# Patient Record
Sex: Female | Born: 1955 | Race: White | Hispanic: No | Marital: Married | Smoking: Never smoker
Health system: Southern US, Community
[De-identification: ages and names within clinical notes are randomized; demographics above are authoritative.]

## PROBLEM LIST (undated history)

## (undated) DIAGNOSIS — T7840XA Allergy, unspecified, initial encounter: Secondary | ICD-10-CM

## (undated) DIAGNOSIS — C44320 Squamous cell carcinoma of skin of unspecified parts of face: Secondary | ICD-10-CM

## (undated) DIAGNOSIS — B019 Varicella without complication: Secondary | ICD-10-CM

## (undated) DIAGNOSIS — K635 Polyp of colon: Secondary | ICD-10-CM

## (undated) DIAGNOSIS — N83209 Unspecified ovarian cyst, unspecified side: Secondary | ICD-10-CM

## (undated) DIAGNOSIS — M1990 Unspecified osteoarthritis, unspecified site: Secondary | ICD-10-CM

## (undated) DIAGNOSIS — E785 Hyperlipidemia, unspecified: Secondary | ICD-10-CM

## (undated) DIAGNOSIS — I10 Essential (primary) hypertension: Secondary | ICD-10-CM

## (undated) HISTORY — DX: Unspecified ovarian cyst, unspecified side: N83.209

## (undated) HISTORY — PX: OOPHORECTOMY: SHX86

## (undated) HISTORY — DX: Unspecified osteoarthritis, unspecified site: M19.90

## (undated) HISTORY — DX: Varicella without complication: B01.9

## (undated) HISTORY — DX: Hyperlipidemia, unspecified: E78.5

## (undated) HISTORY — DX: Squamous cell carcinoma of skin of unspecified parts of face: C44.320

## (undated) HISTORY — DX: Polyp of colon: K63.5

## (undated) HISTORY — DX: Essential (primary) hypertension: I10

## (undated) HISTORY — DX: Allergy, unspecified, initial encounter: T78.40XA

---

## 2002-12-29 HISTORY — PX: OOPHORECTOMY: SHX86

## 2010-12-29 DIAGNOSIS — I10 Essential (primary) hypertension: Secondary | ICD-10-CM

## 2010-12-29 HISTORY — DX: Essential (primary) hypertension: I10

## 2012-12-29 DIAGNOSIS — M1990 Unspecified osteoarthritis, unspecified site: Secondary | ICD-10-CM

## 2012-12-29 HISTORY — DX: Unspecified osteoarthritis, unspecified site: M19.90

## 2015-10-01 ENCOUNTER — Other Ambulatory Visit: Payer: Self-pay | Admitting: Family Medicine

## 2015-10-02 ENCOUNTER — Other Ambulatory Visit: Payer: Self-pay | Admitting: Family Medicine

## 2015-10-02 DIAGNOSIS — N63 Unspecified lump in unspecified breast: Secondary | ICD-10-CM

## 2015-10-05 ENCOUNTER — Ambulatory Visit
Admission: RE | Admit: 2015-10-05 | Discharge: 2015-10-05 | Disposition: A | Discharge status: Home / Self Care | Payer: Commercial Indemnity | Source: Ambulatory Visit | Attending: Family Medicine | Admitting: Family Medicine

## 2015-10-05 ENCOUNTER — Other Ambulatory Visit: Payer: Self-pay | Admitting: Family Medicine

## 2015-10-05 DIAGNOSIS — N63 Unspecified lump in unspecified breast: Secondary | ICD-10-CM

## 2016-10-01 ENCOUNTER — Other Ambulatory Visit: Payer: Self-pay | Admitting: Family Medicine

## 2016-10-01 DIAGNOSIS — Z1231 Encounter for screening mammogram for malignant neoplasm of breast: Secondary | ICD-10-CM

## 2016-10-09 ENCOUNTER — Ambulatory Visit
Admission: RE | Admit: 2016-10-09 | Discharge: 2016-10-09 | Disposition: A | Discharge status: Home / Self Care | Payer: Managed Care, Other (non HMO) | Source: Ambulatory Visit | Attending: Family Medicine | Admitting: Family Medicine

## 2016-10-09 DIAGNOSIS — Z1231 Encounter for screening mammogram for malignant neoplasm of breast: Secondary | ICD-10-CM

## 2017-05-13 ENCOUNTER — Other Ambulatory Visit: Payer: Self-pay | Admitting: Family Medicine

## 2017-05-13 ENCOUNTER — Other Ambulatory Visit (HOSPITAL_COMMUNITY)
Admission: RE | Admit: 2017-05-13 | Discharge: 2017-05-13 | Disposition: A | Discharge status: Home / Self Care | Payer: Managed Care, Other (non HMO) | Source: Ambulatory Visit | Attending: Family Medicine | Admitting: Family Medicine

## 2017-05-13 DIAGNOSIS — Z1151 Encounter for screening for human papillomavirus (HPV): Secondary | ICD-10-CM | POA: Insufficient documentation

## 2017-05-13 DIAGNOSIS — Z01411 Encounter for gynecological examination (general) (routine) with abnormal findings: Secondary | ICD-10-CM | POA: Insufficient documentation

## 2017-05-14 LAB — CYTOLOGY - PAP
Diagnosis: NEGATIVE
HPV: NOT DETECTED

## 2017-11-10 ENCOUNTER — Other Ambulatory Visit: Payer: Self-pay | Admitting: Family Medicine

## 2017-11-10 DIAGNOSIS — Z1231 Encounter for screening mammogram for malignant neoplasm of breast: Secondary | ICD-10-CM

## 2017-12-09 ENCOUNTER — Encounter: Payer: Self-pay | Admitting: Radiology

## 2017-12-09 ENCOUNTER — Ambulatory Visit
Admission: RE | Admit: 2017-12-09 | Discharge: 2017-12-09 | Disposition: A | Discharge status: Home / Self Care | Payer: Managed Care, Other (non HMO) | Source: Ambulatory Visit | Attending: Family Medicine | Admitting: Family Medicine

## 2017-12-09 DIAGNOSIS — Z1231 Encounter for screening mammogram for malignant neoplasm of breast: Secondary | ICD-10-CM

## 2018-06-10 LAB — HM COLONOSCOPY

## 2018-12-17 ENCOUNTER — Other Ambulatory Visit: Payer: Self-pay | Admitting: Family Medicine

## 2018-12-17 DIAGNOSIS — Z1231 Encounter for screening mammogram for malignant neoplasm of breast: Secondary | ICD-10-CM

## 2019-01-24 ENCOUNTER — Ambulatory Visit
Admission: RE | Admit: 2019-01-24 | Discharge: 2019-01-24 | Disposition: A | Discharge status: Home / Self Care | Payer: PRIVATE HEALTH INSURANCE | Source: Ambulatory Visit | Attending: Family Medicine | Admitting: Family Medicine

## 2019-01-24 DIAGNOSIS — Z1231 Encounter for screening mammogram for malignant neoplasm of breast: Secondary | ICD-10-CM

## 2020-01-20 ENCOUNTER — Ambulatory Visit: Payer: PRIVATE HEALTH INSURANCE

## 2020-03-04 ENCOUNTER — Ambulatory Visit: Payer: No Typology Code available for payment source | Attending: Internal Medicine

## 2020-03-04 DIAGNOSIS — Z23 Encounter for immunization: Secondary | ICD-10-CM | POA: Insufficient documentation

## 2020-03-04 NOTE — Progress Notes (Signed)
   Covid-19 Vaccination Clinic  Name:  Sarah Frost    MRN: [id number] DOB: 03-Jun-1956  03/04/2020  [name] was observed post Covid-19 immunization for 15 minutes without incident. She was provided with Vaccine Information Sheet and instruction to access the V-Safe system.   [name] was instructed to call 911 with any severe reactions post vaccine: Marland Kitchen Difficulty breathing  . Swelling of face and throat  . A fast heartbeat  . A bad rash all over body  . Dizziness and weakness   Immunizations Administered    Name Date Dose VIS Date Route   Pfizer COVID-19 Vaccine 03/04/2020  6:23 PM 0.3 mL 12/09/2019 Intramuscular   Manufacturer: [place]   Lot: [id number]   [place]: [id number]

## 2020-03-19 ENCOUNTER — Other Ambulatory Visit: Payer: Self-pay | Admitting: Family Medicine

## 2020-03-19 DIAGNOSIS — Z1231 Encounter for screening mammogram for malignant neoplasm of breast: Secondary | ICD-10-CM

## 2020-03-27 ENCOUNTER — Ambulatory Visit: Payer: No Typology Code available for payment source | Attending: Internal Medicine

## 2020-03-27 DIAGNOSIS — Z23 Encounter for immunization: Secondary | ICD-10-CM

## 2020-03-27 NOTE — Progress Notes (Signed)
   Covid-19 Vaccination Clinic  Name:  Sarah Frost    MRN: [id number] DOB: 03-Mar-1956  03/27/2020  [name] was observed post Covid-19 immunization for 15 minutes without incident. She was provided with Vaccine Information Sheet and instruction to access the V-Safe system.   [name] was instructed to call 911 with any severe reactions post vaccine: Marland Kitchen Difficulty breathing  . Swelling of face and throat  . A fast heartbeat  . A bad rash all over body  . Dizziness and weakness   Immunizations Administered    Name Date Dose VIS Date Route   Pfizer COVID-19 Vaccine 03/27/2020  9:51 AM 0.3 mL 12/09/2019 Intramuscular   Manufacturer: [place]   Lot: [id number]   [place]: [id number]

## 2020-06-26 ENCOUNTER — Other Ambulatory Visit: Payer: Self-pay | Admitting: Family Medicine

## 2020-06-26 DIAGNOSIS — Z1231 Encounter for screening mammogram for malignant neoplasm of breast: Secondary | ICD-10-CM

## 2020-07-10 ENCOUNTER — Other Ambulatory Visit: Payer: Self-pay

## 2020-07-10 ENCOUNTER — Ambulatory Visit
Admission: RE | Admit: 2020-07-10 | Discharge: 2020-07-10 | Disposition: A | Discharge status: Home / Self Care | Payer: No Typology Code available for payment source | Source: Ambulatory Visit

## 2020-07-10 DIAGNOSIS — Z1231 Encounter for screening mammogram for malignant neoplasm of breast: Secondary | ICD-10-CM

## 2020-07-12 ENCOUNTER — Other Ambulatory Visit (HOSPITAL_COMMUNITY)
Admission: RE | Admit: 2020-07-12 | Discharge: 2020-07-12 | Disposition: A | Discharge status: Home / Self Care | Payer: No Typology Code available for payment source | Source: Ambulatory Visit | Attending: Family Medicine | Admitting: Family Medicine

## 2020-07-12 DIAGNOSIS — Z01411 Encounter for gynecological examination (general) (routine) with abnormal findings: Secondary | ICD-10-CM | POA: Diagnosis not present

## 2020-07-13 ENCOUNTER — Other Ambulatory Visit: Payer: Self-pay | Admitting: Family Medicine

## 2020-07-13 DIAGNOSIS — R928 Other abnormal and inconclusive findings on diagnostic imaging of breast: Secondary | ICD-10-CM

## 2020-07-17 LAB — CYTOLOGY - PAP
Comment: NEGATIVE
Diagnosis: NEGATIVE
High risk HPV: NEGATIVE

## 2020-07-18 ENCOUNTER — Other Ambulatory Visit: Payer: No Typology Code available for payment source

## 2020-07-20 LAB — HM PAP SMEAR: HM Pap smear: NORMAL

## 2020-07-20 LAB — RESULTS CONSOLE HPV: CHL HPV: NEGATIVE

## 2020-07-30 ENCOUNTER — Other Ambulatory Visit: Payer: Self-pay

## 2020-07-30 ENCOUNTER — Ambulatory Visit
Admission: RE | Admit: 2020-07-30 | Discharge: 2020-07-30 | Disposition: A | Discharge status: Home / Self Care | Payer: No Typology Code available for payment source | Source: Ambulatory Visit | Attending: Family Medicine | Admitting: Family Medicine

## 2020-07-30 ENCOUNTER — Ambulatory Visit: Payer: No Typology Code available for payment source

## 2020-07-30 DIAGNOSIS — R928 Other abnormal and inconclusive findings on diagnostic imaging of breast: Secondary | ICD-10-CM

## 2020-10-30 DIAGNOSIS — M1990 Unspecified osteoarthritis, unspecified site: Secondary | ICD-10-CM | POA: Insufficient documentation

## 2020-10-30 DIAGNOSIS — G43909 Migraine, unspecified, not intractable, without status migrainosus: Secondary | ICD-10-CM | POA: Insufficient documentation

## 2020-10-30 DIAGNOSIS — Z87448 Personal history of other diseases of urinary system: Secondary | ICD-10-CM | POA: Insufficient documentation

## 2020-10-30 DIAGNOSIS — N8110 Cystocele, unspecified: Secondary | ICD-10-CM | POA: Insufficient documentation

## 2020-10-30 DIAGNOSIS — I10 Essential (primary) hypertension: Secondary | ICD-10-CM | POA: Insufficient documentation

## 2020-10-30 HISTORY — DX: Personal history of other diseases of urinary system: Z87.448

## 2020-10-30 HISTORY — DX: Migraine, unspecified, not intractable, without status migrainosus: G43.909

## 2021-01-23 DIAGNOSIS — L820 Inflamed seborrheic keratosis: Secondary | ICD-10-CM | POA: Diagnosis not present

## 2021-01-23 DIAGNOSIS — L905 Scar conditions and fibrosis of skin: Secondary | ICD-10-CM | POA: Diagnosis not present

## 2021-01-23 DIAGNOSIS — D1801 Hemangioma of skin and subcutaneous tissue: Secondary | ICD-10-CM | POA: Diagnosis not present

## 2021-01-23 DIAGNOSIS — Z85828 Personal history of other malignant neoplasm of skin: Secondary | ICD-10-CM | POA: Diagnosis not present

## 2021-01-23 DIAGNOSIS — D225 Melanocytic nevi of trunk: Secondary | ICD-10-CM | POA: Diagnosis not present

## 2021-01-23 DIAGNOSIS — L821 Other seborrheic keratosis: Secondary | ICD-10-CM | POA: Diagnosis not present

## 2021-01-23 DIAGNOSIS — L570 Actinic keratosis: Secondary | ICD-10-CM | POA: Diagnosis not present

## 2021-01-23 DIAGNOSIS — D229 Melanocytic nevi, unspecified: Secondary | ICD-10-CM | POA: Diagnosis not present

## 2021-01-23 DIAGNOSIS — Z872 Personal history of diseases of the skin and subcutaneous tissue: Secondary | ICD-10-CM | POA: Diagnosis not present

## 2021-02-13 IMAGING — MG MM DIGITAL DIAGNOSTIC UNILAT*R* W/ TOMO W/ CAD
6 series · 6 of 18 positions shown · non-contrast
Comparison: Previous exam(s).

CLINICAL DATA: 64-year-old female recalled from 2D screening
mammogram dated 07/10/2020 for a possible right breast
mass/asymmetry.

EXAM:
DIGITAL DIAGNOSTIC UNILATERAL RIGHT MAMMOGRAM WITH TOMO AND CAD

[R CC synth-2D]
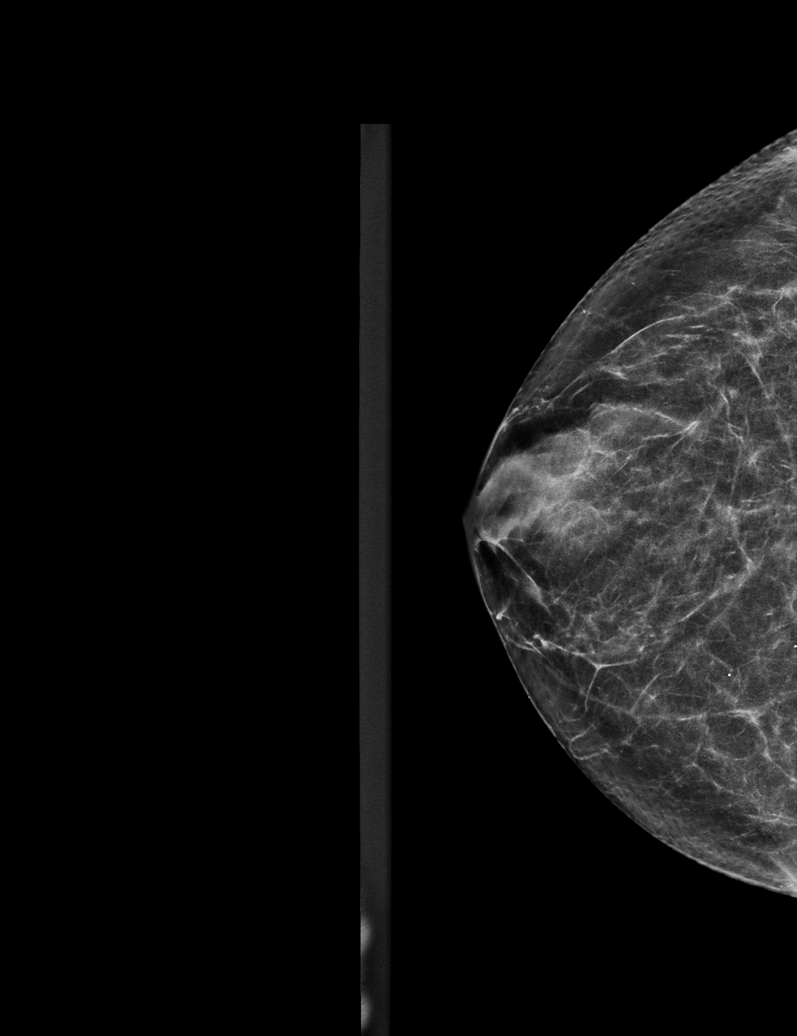

[R XCCL synth-2D]
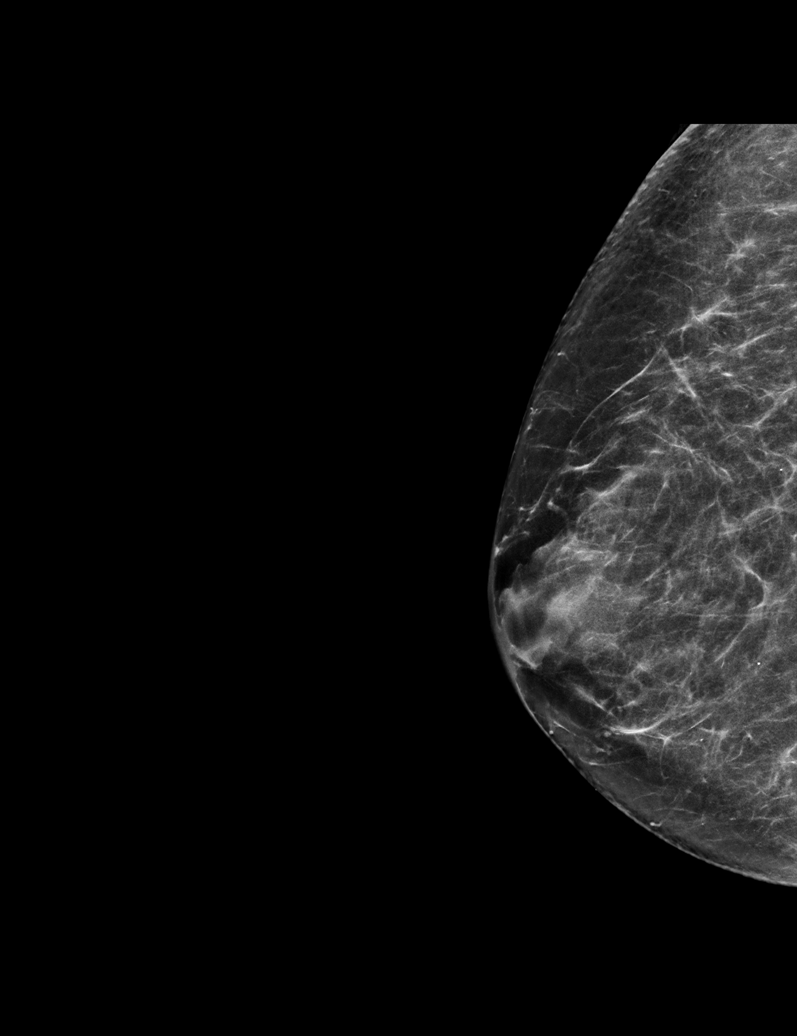

[R MLO synth-2D]
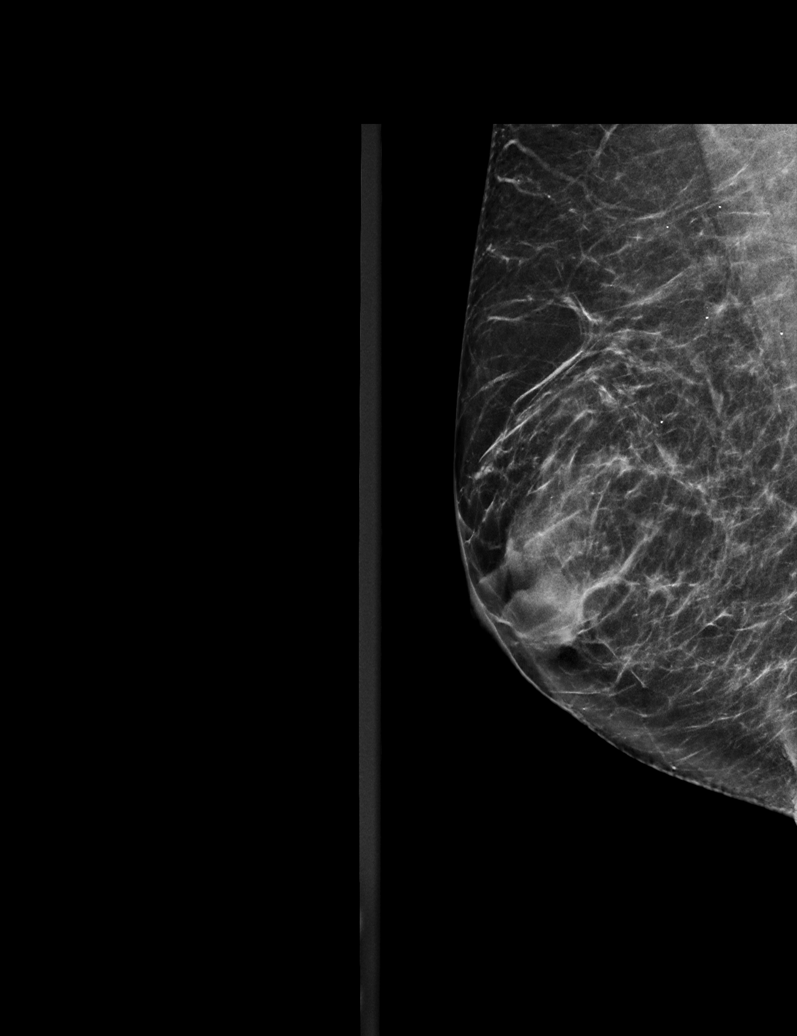

[R XCCL tomo · tomo slice 31/62.0]
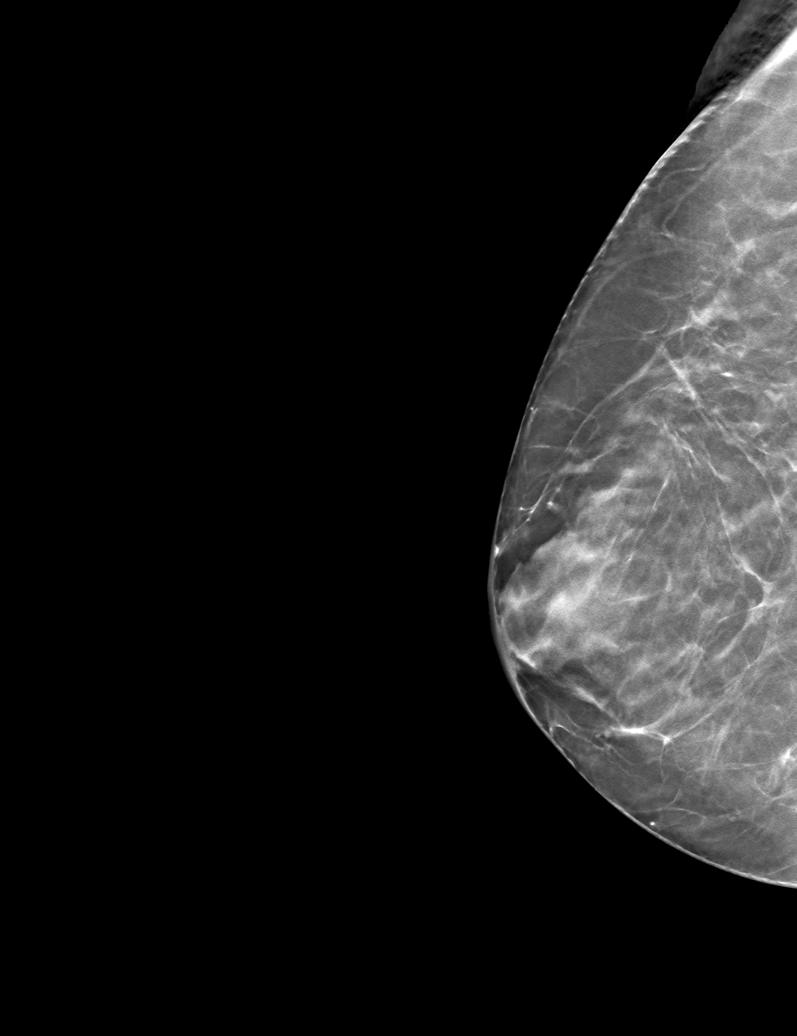

[R CC tomo · tomo slice 28/55.0]
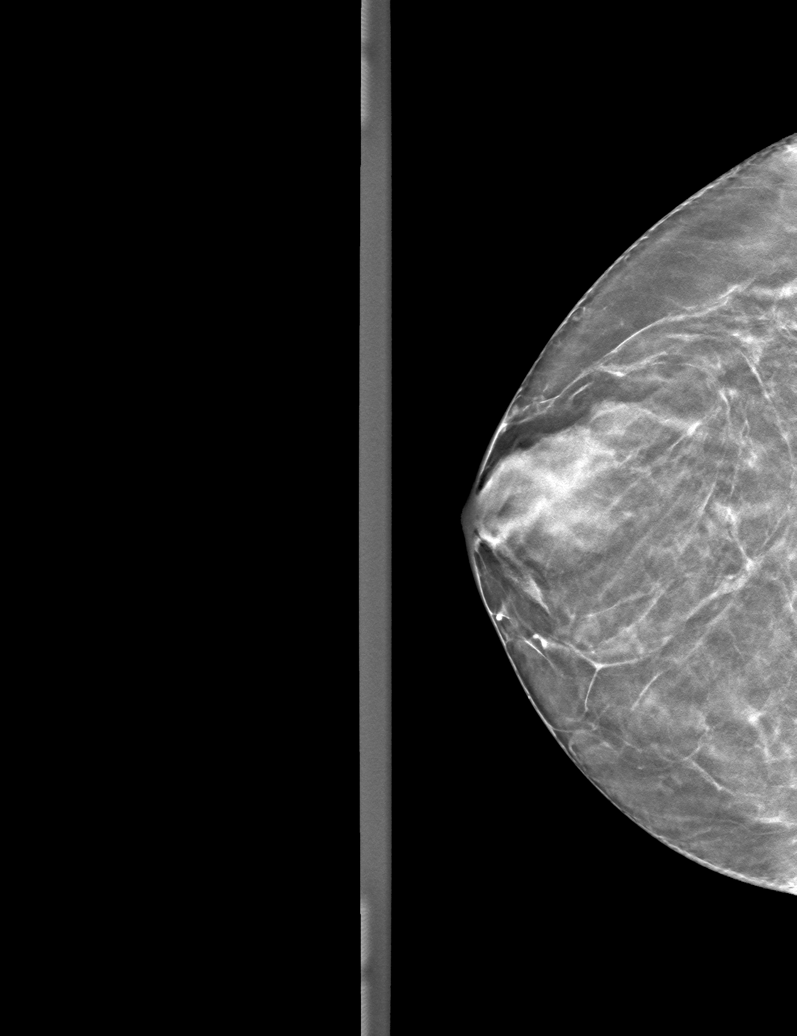

[R MLO tomo · tomo slice 31/60.0]
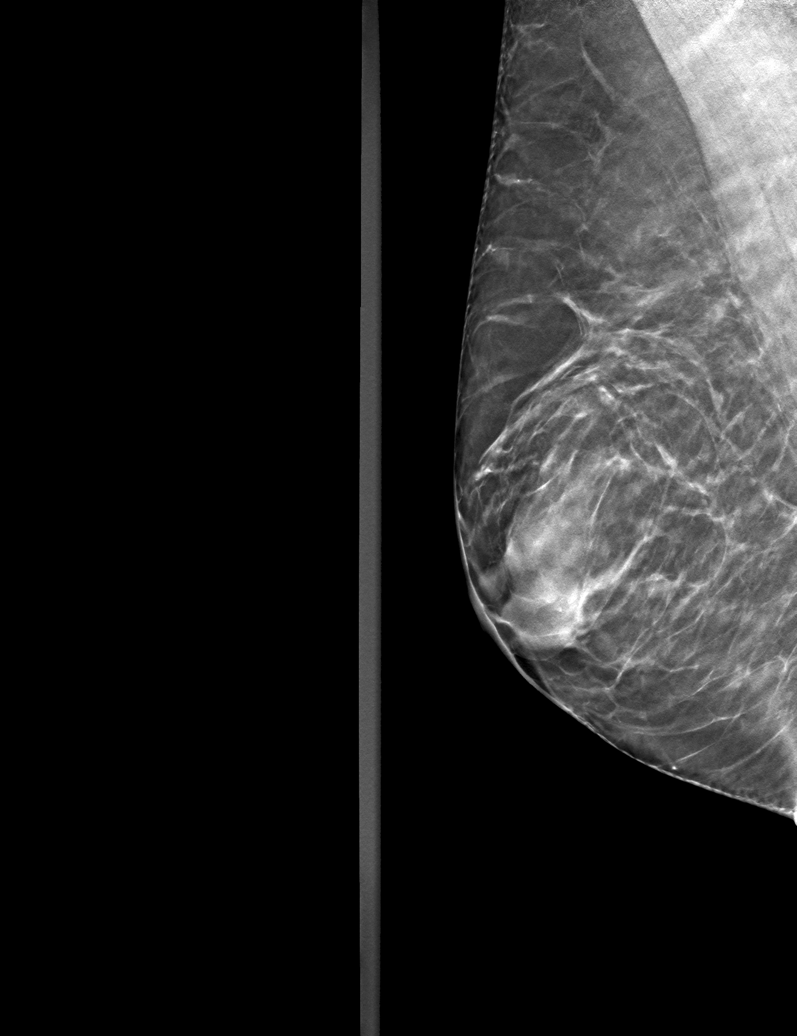

[6 of 18 positions shown; findings below may reference images not displayed]

ACR Breast Density Category b: There are scattered areas of
fibroglandular density.
FINDINGS: Previously described possible mass/asymmetry in the upper outer
right breast resolves into well dispersed fibroglandular tissue on
today's additional 3D views. No suspicious findings are identified.

Mammographic images were processed with CAD.
IMPRESSION: No mammographic evidence of malignancy.

RECOMMENDATION:
Screening mammogram in one year.(Code:[id number])

I have discussed the findings and recommendations with the patient.
If applicable, a reminder letter will be sent to the patient
regarding the next appointment.

BI-RADS CATEGORY  1: Negative.

## 2021-07-17 DIAGNOSIS — M25552 Pain in left hip: Secondary | ICD-10-CM | POA: Diagnosis not present

## 2021-07-17 DIAGNOSIS — M6281 Muscle weakness (generalized): Secondary | ICD-10-CM | POA: Diagnosis not present

## 2021-07-19 DIAGNOSIS — M25552 Pain in left hip: Secondary | ICD-10-CM | POA: Diagnosis not present

## 2021-07-19 DIAGNOSIS — M6281 Muscle weakness (generalized): Secondary | ICD-10-CM | POA: Diagnosis not present

## 2021-07-29 DIAGNOSIS — M6281 Muscle weakness (generalized): Secondary | ICD-10-CM | POA: Diagnosis not present

## 2021-07-29 DIAGNOSIS — M25552 Pain in left hip: Secondary | ICD-10-CM | POA: Diagnosis not present

## 2021-07-30 DIAGNOSIS — I8393 Asymptomatic varicose veins of bilateral lower extremities: Secondary | ICD-10-CM | POA: Diagnosis not present

## 2021-07-30 DIAGNOSIS — L905 Scar conditions and fibrosis of skin: Secondary | ICD-10-CM | POA: Diagnosis not present

## 2021-07-30 DIAGNOSIS — Z85828 Personal history of other malignant neoplasm of skin: Secondary | ICD-10-CM | POA: Diagnosis not present

## 2021-07-30 DIAGNOSIS — L578 Other skin changes due to chronic exposure to nonionizing radiation: Secondary | ICD-10-CM | POA: Diagnosis not present

## 2021-07-30 DIAGNOSIS — D1801 Hemangioma of skin and subcutaneous tissue: Secondary | ICD-10-CM | POA: Diagnosis not present

## 2021-07-30 DIAGNOSIS — D225 Melanocytic nevi of trunk: Secondary | ICD-10-CM | POA: Diagnosis not present

## 2021-07-30 DIAGNOSIS — D485 Neoplasm of uncertain behavior of skin: Secondary | ICD-10-CM | POA: Diagnosis not present

## 2021-07-30 DIAGNOSIS — L821 Other seborrheic keratosis: Secondary | ICD-10-CM | POA: Diagnosis not present

## 2021-07-30 DIAGNOSIS — L820 Inflamed seborrheic keratosis: Secondary | ICD-10-CM | POA: Diagnosis not present

## 2021-07-31 DIAGNOSIS — M6281 Muscle weakness (generalized): Secondary | ICD-10-CM | POA: Diagnosis not present

## 2021-07-31 DIAGNOSIS — M25552 Pain in left hip: Secondary | ICD-10-CM | POA: Diagnosis not present

## 2021-08-01 DIAGNOSIS — Z23 Encounter for immunization: Secondary | ICD-10-CM | POA: Diagnosis not present

## 2021-08-01 DIAGNOSIS — E785 Hyperlipidemia, unspecified: Secondary | ICD-10-CM | POA: Diagnosis not present

## 2021-08-01 DIAGNOSIS — I10 Essential (primary) hypertension: Secondary | ICD-10-CM | POA: Diagnosis not present

## 2021-08-01 DIAGNOSIS — Z1159 Encounter for screening for other viral diseases: Secondary | ICD-10-CM | POA: Diagnosis not present

## 2021-08-01 DIAGNOSIS — Z0000 Encounter for general adult medical examination without abnormal findings: Secondary | ICD-10-CM | POA: Diagnosis not present

## 2021-08-02 LAB — BASIC METABOLIC PANEL
Chloride: 96 — AB (ref 99–108)
Glucose: 84
Potassium: 3.9 mEq/L (ref 3.5–5.1)

## 2021-08-02 LAB — HEPATIC FUNCTION PANEL: Alkaline Phosphatase: 47 (ref 25–125)

## 2021-08-02 LAB — LIPID PANEL
HDL: 85 — AB (ref 35–70)
Triglycerides: 54 (ref 40–160)

## 2021-08-02 LAB — COMPREHENSIVE METABOLIC PANEL
Calcium: 9.6 (ref 8.7–10.7)
eGFR: 102

## 2021-08-06 ENCOUNTER — Other Ambulatory Visit: Payer: Self-pay | Admitting: Family Medicine

## 2021-08-06 DIAGNOSIS — E2839 Other primary ovarian failure: Secondary | ICD-10-CM

## 2021-08-10 LAB — HM HEPATITIS C SCREENING LAB: HM Hepatitis Screen: NEGATIVE

## 2021-08-13 DIAGNOSIS — M6281 Muscle weakness (generalized): Secondary | ICD-10-CM | POA: Diagnosis not present

## 2021-08-13 DIAGNOSIS — M25552 Pain in left hip: Secondary | ICD-10-CM | POA: Diagnosis not present

## 2021-08-15 DIAGNOSIS — M25552 Pain in left hip: Secondary | ICD-10-CM | POA: Diagnosis not present

## 2021-08-15 DIAGNOSIS — M6281 Muscle weakness (generalized): Secondary | ICD-10-CM | POA: Diagnosis not present

## 2021-08-19 ENCOUNTER — Other Ambulatory Visit: Payer: Self-pay | Admitting: Family Medicine

## 2021-08-19 DIAGNOSIS — Z1231 Encounter for screening mammogram for malignant neoplasm of breast: Secondary | ICD-10-CM

## 2021-08-19 DIAGNOSIS — M6281 Muscle weakness (generalized): Secondary | ICD-10-CM | POA: Diagnosis not present

## 2021-08-19 DIAGNOSIS — M25552 Pain in left hip: Secondary | ICD-10-CM | POA: Diagnosis not present

## 2021-08-22 ENCOUNTER — Other Ambulatory Visit: Payer: Self-pay

## 2021-08-22 DIAGNOSIS — M25552 Pain in left hip: Secondary | ICD-10-CM | POA: Diagnosis not present

## 2021-08-22 DIAGNOSIS — M6281 Muscle weakness (generalized): Secondary | ICD-10-CM | POA: Diagnosis not present

## 2021-08-27 DIAGNOSIS — M25552 Pain in left hip: Secondary | ICD-10-CM | POA: Diagnosis not present

## 2021-08-27 DIAGNOSIS — M6281 Muscle weakness (generalized): Secondary | ICD-10-CM | POA: Diagnosis not present

## 2021-08-29 DIAGNOSIS — M6281 Muscle weakness (generalized): Secondary | ICD-10-CM | POA: Diagnosis not present

## 2021-08-29 DIAGNOSIS — M25552 Pain in left hip: Secondary | ICD-10-CM | POA: Diagnosis not present

## 2021-09-03 DIAGNOSIS — M25552 Pain in left hip: Secondary | ICD-10-CM | POA: Diagnosis not present

## 2021-09-03 DIAGNOSIS — M6281 Muscle weakness (generalized): Secondary | ICD-10-CM | POA: Diagnosis not present

## 2021-09-05 DIAGNOSIS — M6281 Muscle weakness (generalized): Secondary | ICD-10-CM | POA: Diagnosis not present

## 2021-09-05 DIAGNOSIS — M25552 Pain in left hip: Secondary | ICD-10-CM | POA: Diagnosis not present

## 2021-09-17 DIAGNOSIS — M6281 Muscle weakness (generalized): Secondary | ICD-10-CM | POA: Diagnosis not present

## 2021-09-17 DIAGNOSIS — M25552 Pain in left hip: Secondary | ICD-10-CM | POA: Diagnosis not present

## 2021-09-20 DIAGNOSIS — M6281 Muscle weakness (generalized): Secondary | ICD-10-CM | POA: Diagnosis not present

## 2021-09-20 DIAGNOSIS — M25552 Pain in left hip: Secondary | ICD-10-CM | POA: Diagnosis not present

## 2021-09-27 ENCOUNTER — Other Ambulatory Visit: Payer: Self-pay | Admitting: Family Medicine

## 2021-09-27 DIAGNOSIS — Z1231 Encounter for screening mammogram for malignant neoplasm of breast: Secondary | ICD-10-CM

## 2021-09-30 DIAGNOSIS — Z1231 Encounter for screening mammogram for malignant neoplasm of breast: Secondary | ICD-10-CM

## 2021-10-17 DIAGNOSIS — H2513 Age-related nuclear cataract, bilateral: Secondary | ICD-10-CM | POA: Diagnosis not present

## 2021-10-17 DIAGNOSIS — Z0100 Encounter for examination of eyes and vision without abnormal findings: Secondary | ICD-10-CM | POA: Diagnosis not present

## 2021-10-28 ENCOUNTER — Ambulatory Visit
Admission: RE | Admit: 2021-10-28 | Discharge: 2021-10-28 | Disposition: A | Discharge status: Home / Self Care | Payer: Medicare HMO | Source: Ambulatory Visit

## 2021-10-28 ENCOUNTER — Other Ambulatory Visit: Payer: Self-pay

## 2021-10-28 DIAGNOSIS — Z1231 Encounter for screening mammogram for malignant neoplasm of breast: Secondary | ICD-10-CM | POA: Diagnosis not present

## 2021-11-11 DIAGNOSIS — J019 Acute sinusitis, unspecified: Secondary | ICD-10-CM | POA: Diagnosis not present

## 2021-11-11 DIAGNOSIS — H6690 Otitis media, unspecified, unspecified ear: Secondary | ICD-10-CM | POA: Diagnosis not present

## 2021-11-15 DIAGNOSIS — I10 Essential (primary) hypertension: Secondary | ICD-10-CM | POA: Diagnosis not present

## 2021-11-15 DIAGNOSIS — H9201 Otalgia, right ear: Secondary | ICD-10-CM | POA: Diagnosis not present

## 2022-01-06 ENCOUNTER — Ambulatory Visit
Admission: RE | Admit: 2022-01-06 | Discharge: 2022-01-06 | Disposition: A | Discharge status: Home / Self Care | Payer: Medicare Other | Source: Ambulatory Visit | Attending: Family Medicine | Admitting: Family Medicine

## 2022-01-06 ENCOUNTER — Other Ambulatory Visit: Payer: Self-pay

## 2022-01-06 DIAGNOSIS — E2839 Other primary ovarian failure: Secondary | ICD-10-CM

## 2022-01-06 DIAGNOSIS — M8589 Other specified disorders of bone density and structure, multiple sites: Secondary | ICD-10-CM | POA: Diagnosis not present

## 2022-01-06 DIAGNOSIS — Z780 Asymptomatic menopausal state: Secondary | ICD-10-CM | POA: Diagnosis not present

## 2022-01-27 DIAGNOSIS — I10 Essential (primary) hypertension: Secondary | ICD-10-CM | POA: Diagnosis not present

## 2022-01-27 DIAGNOSIS — M79606 Pain in leg, unspecified: Secondary | ICD-10-CM | POA: Diagnosis not present

## 2022-01-29 DIAGNOSIS — R252 Cramp and spasm: Secondary | ICD-10-CM | POA: Diagnosis not present

## 2022-01-29 DIAGNOSIS — I872 Venous insufficiency (chronic) (peripheral): Secondary | ICD-10-CM | POA: Diagnosis not present

## 2022-01-29 DIAGNOSIS — I10 Essential (primary) hypertension: Secondary | ICD-10-CM | POA: Diagnosis not present

## 2022-01-30 DIAGNOSIS — M79606 Pain in leg, unspecified: Secondary | ICD-10-CM | POA: Diagnosis not present

## 2022-01-30 DIAGNOSIS — M6281 Muscle weakness (generalized): Secondary | ICD-10-CM | POA: Diagnosis not present

## 2022-01-31 DIAGNOSIS — Z85828 Personal history of other malignant neoplasm of skin: Secondary | ICD-10-CM | POA: Diagnosis not present

## 2022-01-31 DIAGNOSIS — L718 Other rosacea: Secondary | ICD-10-CM | POA: Diagnosis not present

## 2022-01-31 DIAGNOSIS — L570 Actinic keratosis: Secondary | ICD-10-CM | POA: Diagnosis not present

## 2022-01-31 DIAGNOSIS — Z08 Encounter for follow-up examination after completed treatment for malignant neoplasm: Secondary | ICD-10-CM | POA: Diagnosis not present

## 2022-01-31 DIAGNOSIS — D225 Melanocytic nevi of trunk: Secondary | ICD-10-CM | POA: Diagnosis not present

## 2022-01-31 DIAGNOSIS — Z872 Personal history of diseases of the skin and subcutaneous tissue: Secondary | ICD-10-CM | POA: Diagnosis not present

## 2022-01-31 DIAGNOSIS — L814 Other melanin hyperpigmentation: Secondary | ICD-10-CM | POA: Diagnosis not present

## 2022-01-31 DIAGNOSIS — L821 Other seborrheic keratosis: Secondary | ICD-10-CM | POA: Diagnosis not present

## 2022-02-05 DIAGNOSIS — M79606 Pain in leg, unspecified: Secondary | ICD-10-CM | POA: Diagnosis not present

## 2022-02-05 DIAGNOSIS — M6281 Muscle weakness (generalized): Secondary | ICD-10-CM | POA: Diagnosis not present

## 2022-02-06 DIAGNOSIS — F4322 Adjustment disorder with anxiety: Secondary | ICD-10-CM | POA: Diagnosis not present

## 2022-02-07 DIAGNOSIS — M79606 Pain in leg, unspecified: Secondary | ICD-10-CM | POA: Diagnosis not present

## 2022-02-07 DIAGNOSIS — M6281 Muscle weakness (generalized): Secondary | ICD-10-CM | POA: Diagnosis not present

## 2022-02-10 DIAGNOSIS — M6281 Muscle weakness (generalized): Secondary | ICD-10-CM | POA: Diagnosis not present

## 2022-02-10 DIAGNOSIS — M79606 Pain in leg, unspecified: Secondary | ICD-10-CM | POA: Diagnosis not present

## 2022-02-17 DIAGNOSIS — M79606 Pain in leg, unspecified: Secondary | ICD-10-CM | POA: Diagnosis not present

## 2022-02-17 DIAGNOSIS — M6281 Muscle weakness (generalized): Secondary | ICD-10-CM | POA: Diagnosis not present

## 2022-02-20 DIAGNOSIS — F4322 Adjustment disorder with anxiety: Secondary | ICD-10-CM | POA: Diagnosis not present

## 2022-02-20 DIAGNOSIS — M6281 Muscle weakness (generalized): Secondary | ICD-10-CM | POA: Diagnosis not present

## 2022-02-20 DIAGNOSIS — M79606 Pain in leg, unspecified: Secondary | ICD-10-CM | POA: Diagnosis not present

## 2022-02-24 DIAGNOSIS — M79606 Pain in leg, unspecified: Secondary | ICD-10-CM | POA: Diagnosis not present

## 2022-02-24 DIAGNOSIS — M6281 Muscle weakness (generalized): Secondary | ICD-10-CM | POA: Diagnosis not present

## 2022-02-26 DIAGNOSIS — M79606 Pain in leg, unspecified: Secondary | ICD-10-CM | POA: Diagnosis not present

## 2022-02-26 DIAGNOSIS — M6281 Muscle weakness (generalized): Secondary | ICD-10-CM | POA: Diagnosis not present

## 2022-03-03 DIAGNOSIS — F4322 Adjustment disorder with anxiety: Secondary | ICD-10-CM | POA: Diagnosis not present

## 2022-03-12 DIAGNOSIS — I10 Essential (primary) hypertension: Secondary | ICD-10-CM | POA: Diagnosis not present

## 2022-03-12 DIAGNOSIS — I83893 Varicose veins of bilateral lower extremities with other complications: Secondary | ICD-10-CM | POA: Diagnosis not present

## 2022-03-12 DIAGNOSIS — I87323 Chronic venous hypertension (idiopathic) with inflammation of bilateral lower extremity: Secondary | ICD-10-CM | POA: Diagnosis not present

## 2022-03-12 DIAGNOSIS — I872 Venous insufficiency (chronic) (peripheral): Secondary | ICD-10-CM | POA: Diagnosis not present

## 2022-03-12 DIAGNOSIS — R252 Cramp and spasm: Secondary | ICD-10-CM | POA: Diagnosis not present

## 2022-03-13 DIAGNOSIS — U071 COVID-19: Secondary | ICD-10-CM | POA: Diagnosis not present

## 2022-03-17 DIAGNOSIS — F4322 Adjustment disorder with anxiety: Secondary | ICD-10-CM | POA: Diagnosis not present

## 2022-03-19 DIAGNOSIS — M6281 Muscle weakness (generalized): Secondary | ICD-10-CM | POA: Diagnosis not present

## 2022-03-19 DIAGNOSIS — M79606 Pain in leg, unspecified: Secondary | ICD-10-CM | POA: Diagnosis not present

## 2022-03-25 DIAGNOSIS — M6281 Muscle weakness (generalized): Secondary | ICD-10-CM | POA: Diagnosis not present

## 2022-03-25 DIAGNOSIS — M79606 Pain in leg, unspecified: Secondary | ICD-10-CM | POA: Diagnosis not present

## 2022-03-28 DIAGNOSIS — M6281 Muscle weakness (generalized): Secondary | ICD-10-CM | POA: Diagnosis not present

## 2022-03-28 DIAGNOSIS — M79606 Pain in leg, unspecified: Secondary | ICD-10-CM | POA: Diagnosis not present

## 2022-04-01 DIAGNOSIS — M79606 Pain in leg, unspecified: Secondary | ICD-10-CM | POA: Diagnosis not present

## 2022-04-01 DIAGNOSIS — M6281 Muscle weakness (generalized): Secondary | ICD-10-CM | POA: Diagnosis not present

## 2022-04-04 DIAGNOSIS — M79606 Pain in leg, unspecified: Secondary | ICD-10-CM | POA: Diagnosis not present

## 2022-04-04 DIAGNOSIS — M6281 Muscle weakness (generalized): Secondary | ICD-10-CM | POA: Diagnosis not present

## 2022-04-08 ENCOUNTER — Encounter: Payer: Self-pay | Admitting: Family Medicine

## 2022-04-08 DIAGNOSIS — E785 Hyperlipidemia, unspecified: Secondary | ICD-10-CM | POA: Insufficient documentation

## 2022-04-08 DIAGNOSIS — M25552 Pain in left hip: Secondary | ICD-10-CM | POA: Diagnosis not present

## 2022-04-08 DIAGNOSIS — N819 Female genital prolapse, unspecified: Secondary | ICD-10-CM | POA: Insufficient documentation

## 2022-04-08 DIAGNOSIS — E2839 Other primary ovarian failure: Secondary | ICD-10-CM | POA: Insufficient documentation

## 2022-04-08 DIAGNOSIS — M9905 Segmental and somatic dysfunction of pelvic region: Secondary | ICD-10-CM | POA: Diagnosis not present

## 2022-04-08 DIAGNOSIS — Z860100 Personal history of colon polyps, unspecified: Secondary | ICD-10-CM | POA: Insufficient documentation

## 2022-04-08 DIAGNOSIS — M79606 Pain in leg, unspecified: Secondary | ICD-10-CM | POA: Diagnosis not present

## 2022-04-08 DIAGNOSIS — M6281 Muscle weakness (generalized): Secondary | ICD-10-CM | POA: Diagnosis not present

## 2022-04-08 DIAGNOSIS — I83893 Varicose veins of bilateral lower extremities with other complications: Secondary | ICD-10-CM | POA: Insufficient documentation

## 2022-04-08 DIAGNOSIS — M545 Low back pain, unspecified: Secondary | ICD-10-CM | POA: Diagnosis not present

## 2022-04-08 DIAGNOSIS — M25562 Pain in left knee: Secondary | ICD-10-CM | POA: Diagnosis not present

## 2022-04-09 ENCOUNTER — Ambulatory Visit (INDEPENDENT_AMBULATORY_CARE_PROVIDER_SITE_OTHER): Payer: Medicare Other | Admitting: Family Medicine

## 2022-04-09 ENCOUNTER — Encounter: Payer: Self-pay | Admitting: Family Medicine

## 2022-04-09 VITALS — BP 138/75 | HR 64 | Temp 98.5°F | Ht 65.5 in | Wt 148.0 lb

## 2022-04-09 DIAGNOSIS — Z7689 Persons encountering health services in other specified circumstances: Secondary | ICD-10-CM | POA: Diagnosis not present

## 2022-04-09 DIAGNOSIS — Z8249 Family history of ischemic heart disease and other diseases of the circulatory system: Secondary | ICD-10-CM | POA: Diagnosis not present

## 2022-04-09 DIAGNOSIS — E782 Mixed hyperlipidemia: Secondary | ICD-10-CM | POA: Diagnosis not present

## 2022-04-09 DIAGNOSIS — I10 Essential (primary) hypertension: Secondary | ICD-10-CM

## 2022-04-09 DIAGNOSIS — I83893 Varicose veins of bilateral lower extremities with other complications: Secondary | ICD-10-CM | POA: Diagnosis not present

## 2022-04-09 DIAGNOSIS — E2839 Other primary ovarian failure: Secondary | ICD-10-CM

## 2022-04-09 DIAGNOSIS — M8580 Other specified disorders of bone density and structure, unspecified site: Secondary | ICD-10-CM | POA: Diagnosis not present

## 2022-04-09 MED ORDER — ESTRADIOL 0.1 MG/GM VA CREA: TOPICAL_CREAM | VAGINAL | 11 refills | Status: DC

## 2022-04-09 MED ORDER — TRIAMTERENE-HCTZ 37.5-25 MG PO TABS: 1.0000 | ORAL_TABLET | Freq: Every day | ORAL | 1 refills | Status: DC

## 2022-04-09 NOTE — Progress Notes (Signed)
? ? ? ?Patient ID: Sarah Frost, female  DOB: [birth year], 66 y.o.   MRN: [id number] ?Patient Care Team  ?  Relationship Specialty Notifications Start End  ?Ma Hillock, DO PCP - General Family Medicine  04/09/22   ?[facility] Consulting Physician Dermatology  04/09/22   ?Tyson Dense, MD Consulting Physician Obstetrics and Gynecology  04/09/22   ?Stephannie Li, [place]  Ophthalmology  04/09/22   ?Ronnette Juniper, MD Consulting Physician Gastroenterology  04/09/22   ?[place] Physician Peripheral Vascular Disease  04/09/22   ?Gerda Diss, DO Referring Physician Sports Medicine  04/09/22   ? ? ?Chief Complaint  ?Patient presents with  ? Establish Care  ? Hypertension  ?  [place]; pt is fasting  ? ? ?Subjective: ?Sarah Frost is a 66 y.o.  female present for new patient establishment. ?All past medical history, surgical history, allergies, family history, immunizations, medications and social history were updated in the electronic medical record today. ?All recent labs, ED visits and hospitalizations within the last year were reviewed. ? ?HTN/HLD: ?Pt reports compliance with triamterene-hydrochlorothiazide 37.5-25. Patient denies chest pain, shortness of breath or lower extremity edema. Pt does not take a daily baby ASA. Pt is not prescribed statin. ?Diet: Low-sodium ?RF:fh heart disease, HTN, HLD ? ?Osteopenia: T score -1.3 completed January 2023.  She does supplement with calcium and vitamin D.  She exercises routinely. ? ?Estrogen deficiency: Patient reports she would like to continue her Estrace 0.1 mg/GM vaginal cream twice weekly. ? ?  04/09/2022  ?  8:46 AM  ?Depression screen PHQ 2/9  ?Decreased Interest 0  ?Down, Depressed, Hopeless 0  ?PHQ - 2 Score 0  ? ?   ? View : No data to display.  ?  ?  ?  ? ?  ?  ? ?  04/09/2022  ?  8:46 AM  ?Fall Risk   ?Falls in the past year? 0  ?Number falls in past yr: 0  ?Injury with Fall? 0  ?Risk for fall due to : No Fall Risks  ?Follow  up Falls evaluation completed  ? ? ?Immunization History  ?Administered Date(s) Administered  ? Influenza Split 11/01/2018, 09/16/2021  ? Influenza,inj,Quad PF,6+ Mos 10/20/2016, 11/01/2018, 10/04/2020  ? Influenza,inj,quad, With Preservative 09/18/2015  ? PFIZER(Purple Top)SARS-COV-2 Vaccination 03/04/2020, 03/27/2020, 10/04/2020, 04/17/2021, 09/16/2021  ? PNEUMOCOCCAL CONJUGATE-20 08/01/2021  ? Tdap 09/18/2015  ? Zoster Recombinat (Shingrix) 08/28/2021, 10/29/2021  ? Zoster, Live 08/28/2021, 10/29/2021  ? ? ?No results found. ? ?Past Medical History:  ?Diagnosis Date  ? Allergy   ? Arthritis 2014  ? Chicken pox   ? Colon polyps   ? History of cystocele 10/30/2020  ? Hyperlipidemia, unspecified   ? Hypertension 2012  ? Migraine 10/30/2020  ? Ovarian cyst   ? Squamous cell skin cancer, face   ? ?Allergies  ?Allergen Reactions  ? Penicillins Hives  ? ?Past Surgical History:  ?Procedure Laterality Date  ? OOPHORECTOMY Right 2004  ? large cyst of ovary-benign  ? ?Family History  ?Problem Relation Age of Onset  ? Hyperlipidemia Mother   ? Arthritis Mother   ? Heart disease Mother   ? Hypertension Mother   ? Vision loss Mother   ? Varicose Veins Mother   ? Hyperlipidemia Father   ? Arthritis Father   ? Heart disease Father   ? Hypertension Father   ? Heart attack Father   ? Arthritis Brother   ? Hyperlipidemia Brother   ? Hypertension  Brother   ? Healthy Daughter   ?     Sarah Frost, Sarah Frost and Sarah Frost  ? Drug abuse Son   ? Colon cancer Maternal Aunt   ? Hyperlipidemia Maternal Grandmother   ? Hypertension Maternal Grandmother   ? Heart attack Maternal Grandmother   ? Heart disease Maternal Grandmother   ? Colon cancer Maternal Grandfather   ? Hyperlipidemia Paternal Grandmother   ? Hypertension Paternal Grandmother   ? Heart attack Paternal Grandmother   ? Heart disease Paternal Grandmother   ? Hypertension Paternal Grandfather   ? Hyperlipidemia Paternal Grandfather   ? Heart attack Paternal Grandfather   ? Heart disease  Paternal Grandfather   ? ?Social History  ? ?Social History Narrative  ? Marital status/children/pets: Married  ? Education/employment: B.A., employed in  [employer]  ? Safety:   ?   -Wears a bicycle helmet riding a bike: Yes  ?   -smoke alarm in the home:Yes  ?   - wears seatbelt: Yes  ?   - Feels safe in their relationships: Yes  ?   ? ? ?Allergies as of 04/09/2022   ? ?   Reactions  ? Penicillins Hives  ? ?  ? ?  ?Medication List  ?  ? ?  ? Accurate as of April 09, 2022  2:43 PM. If you have any questions, ask your nurse or doctor.  ?  ?  ? ?  ? ?STOP taking these medications   ? ?CALCIUM PO ?Stopped by: Howard Pouch, DO ?  ?meloxicam 15 MG tablet ?Commonly known as: MOBIC ?Stopped by: Howard Pouch, DO ?  ? ?  ? ?TAKE these medications   ? ?calcium carbonate 1500 (600 Ca) MG Tabs tablet ?Commonly known as: OSCAL ?1 tablet with meals ?  ?ELDERBERRY PO ?Elderberry ?  ?estradiol 0.1 MG/GM vaginal cream ?Commonly known as: ESTRACE ?2x weekly ?What changed:  ?how to take this ?additional instructions ?Changed by: Howard Pouch, DO ?  ?loratadine 10 MG tablet ?Commonly known as: CLARITIN ?1 tablet ?  ?magnesium citrate Soln ?Take 296 mLs by mouth once. ?  ?triamterene-hydrochlorothiazide 37.5-25 MG tablet ?Commonly known as: MAXZIDE-25 ?Take 1 tablet by mouth daily. ?What changed:  ?how much to take ?how to take this ?when to take this ?Changed by: Howard Pouch, DO ?  ? ?  ? ? ?All past medical history, surgical history, allergies, family history, immunizations andmedications were updated in the EMR today and reviewed under the history and medication portions of their EMR.    ?No results found for this or any previous visit (from the past 2160 hour(s)). ? ?DG BONE DENSITY (DXA) ?Result Date: 01/06/2022 ?ASSESSMENT: The BMD measured at Femur Neck Left is 0.854 g/cm2 with a T-score of -1.3. This patient is considered osteopenic/low bone mass according to [facility]) criteria.  ?FRAX* 10-year  Probability of Fracture Based on femoral neck BMD: DualFemur (Left) Major Osteoporotic Fracture: 16.4%/ Hip Fracture: The probability of a hip fracture is 1.0% within the next ten years.  ? ? ?ROS ?14 pt review of systems performed and negative (unless mentioned in an HPI) ? ?Objective: ?BP 138/75   Pulse 64   Temp 98.5 ?F (36.9 ?C) (Oral)   Ht 5' 5.5" (1.664 m)   Wt 148 lb (67.1 kg)   SpO2 100%   BMI 24.25 kg/m?  ?Physical Exam ?Vitals and nursing note reviewed.  ?Constitutional:   ?   General: She is not in acute distress. ?  Appearance: Normal appearance. She is not ill-appearing, toxic-appearing or diaphoretic.  ?HENT:  ?   Head: Normocephalic and atraumatic.  ?Eyes:  ?   General: No scleral icterus.    ?   Right eye: No discharge.     ?   Left eye: No discharge.  ?   Extraocular Movements: Extraocular movements intact.  ?   Conjunctiva/sclera: Conjunctivae normal.  ?   Pupils: Pupils are equal, round, and reactive to light.  ?Cardiovascular:  ?   Rate and Rhythm: Normal rate and regular rhythm.  ?   Heart sounds: No murmur heard. ?Pulmonary:  ?   Effort: Pulmonary effort is normal. No respiratory distress.  ?   Breath sounds: Normal breath sounds. No wheezing, rhonchi or rales.  ?Musculoskeletal:  ?   Cervical back: Neck supple. No tenderness.  ?   Right lower leg: No edema.  ?   Left lower leg: No edema.  ?Lymphadenopathy:  ?   Cervical: No cervical adenopathy.  ?Skin: ?   General: Skin is warm and dry.  ?   Coloration: Skin is not jaundiced or pale.  ?   Findings: No erythema or rash.  ?Neurological:  ?   Mental Status: She is alert and oriented to person, place, and time. Mental status is at baseline.  ?   Motor: No weakness.  ?   Gait: Gait normal.  ?Psychiatric:     ?   Mood and Affect: Mood normal.     ?   Behavior: Behavior normal.     ?   Thought Content: Thought content normal.     ?   Judgment: Judgment normal.  ?  ? ?Assessment/plan: ?LAMEKA DISLA is a 66 y.o. female present for est  care/Establishing care with new doctor, encounter for ?Primary hypertension/Mixed hyperlipidemia/FH: heart disease ?Stable. ?Continue routine exercise and low-sodium diet. ?Continue triamterene-HCTZ ?Labs due next visit ?

## 2022-04-09 NOTE — Patient Instructions (Signed)
Great to see you today.  I have refilled the medication(s) we provide.   If labs were collected, we will inform you of lab results once received either by echart message or telephone call.   - echart message- for normal results that have been seen by the patient already.   - telephone call: abnormal results or if patient has not viewed results in their echart.  

## 2022-04-18 ENCOUNTER — Encounter: Payer: Self-pay | Admitting: Family Medicine

## 2022-04-18 ENCOUNTER — Ambulatory Visit (INDEPENDENT_AMBULATORY_CARE_PROVIDER_SITE_OTHER): Payer: Medicare Other | Admitting: Family Medicine

## 2022-04-18 VITALS — BP 149/78 | HR 70 | Temp 98.5°F | Ht 65.5 in | Wt 147.0 lb

## 2022-04-18 DIAGNOSIS — J988 Other specified respiratory disorders: Secondary | ICD-10-CM | POA: Diagnosis not present

## 2022-04-18 DIAGNOSIS — R051 Acute cough: Secondary | ICD-10-CM

## 2022-04-18 DIAGNOSIS — F4322 Adjustment disorder with anxiety: Secondary | ICD-10-CM | POA: Diagnosis not present

## 2022-04-18 DIAGNOSIS — B9789 Other viral agents as the cause of diseases classified elsewhere: Secondary | ICD-10-CM

## 2022-04-18 LAB — POCT RAPID STREP A (OFFICE): Rapid Strep A Screen: NEGATIVE

## 2022-04-18 LAB — POC COVID19 BINAXNOW: SARS Coronavirus 2 Ag: NEGATIVE

## 2022-04-18 MED ORDER — FLUTICASONE PROPIONATE 50 MCG/ACT NA SUSP: 2.0000 | Freq: Two times a day (BID) | NASAL | 6 refills | Status: DC

## 2022-04-18 MED ORDER — BENZONATATE 100 MG PO CAPS: 200.0000 mg | ORAL_CAPSULE | Freq: Two times a day (BID) | ORAL | 0 refills | Status: DC | PRN

## 2022-04-18 NOTE — Progress Notes (Signed)
? ? ? ?This visit occurred during the SARS-CoV-2 public health emergency.  Safety protocols were in place, including screening questions prior to the visit, additional usage of staff PPE, and extensive cleaning of exam room while observing appropriate contact time as indicated for disinfecting solutions.  ? ? ?ANADELIA Frost , 1956/11/19, 66 y.o., female ?MRN: [id number] ?Patient Care Team  ?  Relationship Specialty Notifications Start End  ?Ma Hillock, DO PCP - General Family Medicine  04/09/22   ?[facility] Dermatology Consulting Physician Dermatology  04/09/22   ?Tyson Dense, MD Consulting Physician Obstetrics and Gynecology  04/09/22   ?Stephannie Li, [place]  Ophthalmology  04/09/22   ?Ronnette Juniper, MD Consulting Physician Gastroenterology  04/09/22   ?[place] Physician Peripheral Vascular Disease  04/09/22   ?Gerda Diss, DO Referring Physician Sports Medicine  04/09/22   ? ? ?Chief Complaint  ?Patient presents with  ? Cough  ?  Pt c/o cough, nasal drainage x 1 day  ? ?  ?Subjective: Pt presents for an OV with complaints of cough  of nasal drainage of 1 days duration.  She reports the cough is dry.  She does have a grandson in daycare.  She also was out of town over the last week.  Her husband woke up today with similar symptoms.  She is currently not on any antihistamines.  She did have leftover Tessalon Perles from prior and has been taking what was left in feels they do work well for her.  She is tolerating p.o. ? ? ?  04/09/2022  ?  8:46 AM  ?Depression screen PHQ 2/9  ?Decreased Interest 0  ?Down, Depressed, Hopeless 0  ?PHQ - 2 Score 0  ? ? ?Allergies  ?Allergen Reactions  ? Penicillins Hives  ? ?Social History  ? ?Social History Narrative  ? Marital status/children/pets: Married  ? Education/employment: B.A., employed in  [employer]  ? Safety:   ?   -Wears a bicycle helmet riding a bike: Yes  ?   -smoke alarm in the home:Yes  ?   - wears seatbelt: Yes  ?   -  Feels safe in their relationships: Yes  ?   ? ?Past Medical History:  ?Diagnosis Date  ? Allergy   ? Arthritis 2014  ? Chicken pox   ? Colon polyps   ? History of cystocele 10/30/2020  ? Hyperlipidemia, unspecified   ? Hypertension 2012  ? Migraine 10/30/2020  ? Ovarian cyst   ? Squamous cell skin cancer, face   ? ?Past Surgical History:  ?Procedure Laterality Date  ? OOPHORECTOMY Right 2004  ? large cyst of ovary-benign  ? ?Family History  ?Problem Relation Age of Onset  ? Hyperlipidemia Mother   ? Arthritis Mother   ? Heart disease Mother   ? Hypertension Mother   ? Vision loss Mother   ? Varicose Veins Mother   ? Hyperlipidemia Father   ? Arthritis Father   ? Heart disease Father   ? Hypertension Father   ? Heart attack Father   ? Arthritis Brother   ? Hyperlipidemia Brother   ? Hypertension Brother   ? Healthy Daughter   ?     Sarah Frost, danielle and allison  ? Drug abuse Son   ? Colon cancer Maternal Aunt   ? Hyperlipidemia Maternal Grandmother   ? Hypertension Maternal Grandmother   ? Heart attack Maternal Grandmother   ? Heart disease Maternal Grandmother   ? Colon cancer Maternal Grandfather   ?  Hyperlipidemia Paternal Grandmother   ? Hypertension Paternal Grandmother   ? Heart attack Paternal Grandmother   ? Heart disease Paternal Grandmother   ? Hypertension Paternal Grandfather   ? Hyperlipidemia Paternal Grandfather   ? Heart attack Paternal Grandfather   ? Heart disease Paternal Grandfather   ? ?Allergies as of 04/18/2022   ? ?   Reactions  ? Penicillins Hives  ? ?  ? ?  ?Medication List  ?  ? ?  ? Accurate as of April 18, 2022  5:03 PM. If you have any questions, ask your nurse or doctor.  ?  ?  ? ?  ? ?benzonatate 100 MG capsule ?Commonly known as: TESSALON ?Take 2 capsules (200 mg total) by mouth 2 (two) times daily as needed for cough. ?Started by: Howard Pouch, DO ?  ?calcium carbonate 1500 (600 Ca) MG Tabs tablet ?Commonly known as: OSCAL ?1 tablet with meals ?  ?ELDERBERRY PO ?Elderberry ?  ?estradiol  0.1 MG/GM vaginal cream ?Commonly known as: ESTRACE ?2x weekly ?  ?fluticasone 50 MCG/ACT nasal spray ?Commonly known as: FLONASE ?Place 2 sprays into both nostrils 2 (two) times daily. ?Started by: Howard Pouch, DO ?  ?loratadine 10 MG tablet ?Commonly known as: CLARITIN ?1 tablet ?  ?magnesium citrate Soln ?Take 296 mLs by mouth once. ?  ?triamterene-hydrochlorothiazide 37.5-25 MG tablet ?Commonly known as: MAXZIDE-25 ?Take 1 tablet by mouth daily. ?  ? ?  ? ? ?All past medical history, surgical history, allergies, family history, immunizations andmedications were updated in the EMR today and reviewed under the history and medication portions of their EMR.    ? ?Review of Systems  ?Constitutional:  Negative for chills, fever and malaise/fatigue.  ?HENT:  Positive for congestion. Negative for ear discharge, ear pain, sinus pain and sore throat.   ?Respiratory:  Positive for cough. Negative for sputum production, shortness of breath and wheezing.   ?Gastrointestinal:  Negative for abdominal pain, nausea and vomiting.  ?Skin:  Negative for rash.  ?Neurological:  Negative for headaches.  ?Negative, with the exception of above mentioned in HPI ? ? ?Objective:  ?BP (!) 149/78   Pulse 70   Temp 98.5 ?F (36.9 ?C) (Oral)   Ht 5' 5.5" (1.664 m)   Wt 147 lb (66.7 kg)   SpO2 98%   BMI 24.09 kg/m?  ?Body mass index is 24.09 kg/m?Marland Kitchen ?Physical Exam ?Vitals and nursing note reviewed.  ?Constitutional:   ?   General: She is not in acute distress. ?   Appearance: Normal appearance. She is normal weight. She is not ill-appearing or toxic-appearing.  ?HENT:  ?   Right Ear: Ear canal normal. No tenderness. A middle ear effusion is present. Tympanic membrane is not erythematous.  ?   Left Ear: Ear canal normal. No tenderness. A middle ear effusion is present. Tympanic membrane is not erythematous.  ?   Nose: Mucosal edema, congestion and rhinorrhea present. No nasal tenderness. Rhinorrhea is clear.  ?   Right Turbinates: Not  enlarged.  ?   Left Turbinates: Not enlarged.  ?   Right Sinus: No maxillary sinus tenderness or frontal sinus tenderness.  ?   Left Sinus: No maxillary sinus tenderness or frontal sinus tenderness.  ?Eyes:  ?   General:     ?   Right eye: No discharge.     ?   Left eye: No discharge.  ?   Extraocular Movements: Extraocular movements intact.  ?   Conjunctiva/sclera: Conjunctivae normal.  ?  Pupils: Pupils are equal, round, and reactive to light.  ?Cardiovascular:  ?   Rate and Rhythm: Normal rate and regular rhythm.  ?   Heart sounds: No murmur heard. ?Pulmonary:  ?   Effort: Pulmonary effort is normal.  ?   Breath sounds: Normal breath sounds. No wheezing, rhonchi or rales.  ?Musculoskeletal:  ?   Cervical back: Neck supple.  ?Lymphadenopathy:  ?   Cervical: No cervical adenopathy.  ?Skin: ?   Findings: No rash.  ?Neurological:  ?   Mental Status: She is alert and oriented to person, place, and time. Mental status is at baseline.  ?Psychiatric:     ?   Mood and Affect: Mood normal.     ?   Behavior: Behavior normal.     ?   Thought Content: Thought content normal.     ?   Judgment: Judgment normal.  ? ? ? ?No results found. ?No results found. ?Results for orders placed or performed in visit on 04/18/22 (from the past 24 hour(s))  ?POCT rapid strep A     Status: Normal  ? Collection Time: 04/18/22  5:02 PM  ?Result Value Ref Range  ? Rapid Strep A Screen Negative Negative  ?POC COVID-19 BinaxNow     Status: Normal  ? Collection Time: 04/18/22  5:02 PM  ?Result Value Ref Range  ? SARS Coronavirus 2 Ag Negative Negative  ? ? ?Assessment/Plan: ?Sarah Frost is a 66 y.o. female present for OV for  ?Acute cough/viral respiratory illness ?She has mild symptoms today.  Likely viral illness.  She understands antibiotics would not be effective at this time. ?Prescribe Flonase nasal spray to use twice daily.  Patient was encouraged to flush nasal passages with nasal saline prior to Flonase use. ?Tessalon Perles prescribed  for cough. ?Continue Mucinex DM and throat lozenges. ?- POCT rapid strep A ?- POC COVID-19 BinaxNow ?Patient understands if symptoms rebound after 10 to 14 days it could then be considered a bacterial infection and

## 2022-04-18 NOTE — Patient Instructions (Signed)

## 2022-05-01 DIAGNOSIS — M25562 Pain in left knee: Secondary | ICD-10-CM | POA: Diagnosis not present

## 2022-05-01 DIAGNOSIS — M9905 Segmental and somatic dysfunction of pelvic region: Secondary | ICD-10-CM | POA: Diagnosis not present

## 2022-05-01 DIAGNOSIS — M9906 Segmental and somatic dysfunction of lower extremity: Secondary | ICD-10-CM | POA: Diagnosis not present

## 2022-05-05 DIAGNOSIS — M6281 Muscle weakness (generalized): Secondary | ICD-10-CM | POA: Diagnosis not present

## 2022-05-13 DIAGNOSIS — M6281 Muscle weakness (generalized): Secondary | ICD-10-CM | POA: Diagnosis not present

## 2022-05-14 DIAGNOSIS — M6281 Muscle weakness (generalized): Secondary | ICD-10-CM | POA: Diagnosis not present

## 2022-05-21 DIAGNOSIS — M6281 Muscle weakness (generalized): Secondary | ICD-10-CM | POA: Diagnosis not present

## 2022-05-23 DIAGNOSIS — M9905 Segmental and somatic dysfunction of pelvic region: Secondary | ICD-10-CM | POA: Diagnosis not present

## 2022-05-23 DIAGNOSIS — M79652 Pain in left thigh: Secondary | ICD-10-CM | POA: Diagnosis not present

## 2022-05-23 DIAGNOSIS — R2689 Other abnormalities of gait and mobility: Secondary | ICD-10-CM | POA: Diagnosis not present

## 2022-05-23 DIAGNOSIS — M9906 Segmental and somatic dysfunction of lower extremity: Secondary | ICD-10-CM | POA: Diagnosis not present

## 2022-05-23 DIAGNOSIS — M25562 Pain in left knee: Secondary | ICD-10-CM | POA: Diagnosis not present

## 2022-05-23 DIAGNOSIS — M9904 Segmental and somatic dysfunction of sacral region: Secondary | ICD-10-CM | POA: Diagnosis not present

## 2022-05-30 DIAGNOSIS — M6281 Muscle weakness (generalized): Secondary | ICD-10-CM | POA: Diagnosis not present

## 2022-06-02 DIAGNOSIS — M6281 Muscle weakness (generalized): Secondary | ICD-10-CM | POA: Diagnosis not present

## 2022-06-05 DIAGNOSIS — M6281 Muscle weakness (generalized): Secondary | ICD-10-CM | POA: Diagnosis not present

## 2022-06-11 DIAGNOSIS — M6281 Muscle weakness (generalized): Secondary | ICD-10-CM | POA: Diagnosis not present

## 2022-06-12 DIAGNOSIS — M6281 Muscle weakness (generalized): Secondary | ICD-10-CM | POA: Diagnosis not present

## 2022-06-16 DIAGNOSIS — M6281 Muscle weakness (generalized): Secondary | ICD-10-CM | POA: Diagnosis not present

## 2022-06-19 DIAGNOSIS — M6281 Muscle weakness (generalized): Secondary | ICD-10-CM | POA: Diagnosis not present

## 2022-06-24 DIAGNOSIS — M6281 Muscle weakness (generalized): Secondary | ICD-10-CM | POA: Diagnosis not present

## 2022-06-25 DIAGNOSIS — M25562 Pain in left knee: Secondary | ICD-10-CM | POA: Diagnosis not present

## 2022-06-25 DIAGNOSIS — M9903 Segmental and somatic dysfunction of lumbar region: Secondary | ICD-10-CM | POA: Diagnosis not present

## 2022-06-25 DIAGNOSIS — M9904 Segmental and somatic dysfunction of sacral region: Secondary | ICD-10-CM | POA: Diagnosis not present

## 2022-06-25 DIAGNOSIS — M9905 Segmental and somatic dysfunction of pelvic region: Secondary | ICD-10-CM | POA: Diagnosis not present

## 2022-06-25 DIAGNOSIS — M545 Low back pain, unspecified: Secondary | ICD-10-CM | POA: Diagnosis not present

## 2022-06-25 DIAGNOSIS — M9906 Segmental and somatic dysfunction of lower extremity: Secondary | ICD-10-CM | POA: Diagnosis not present

## 2022-06-25 DIAGNOSIS — M79652 Pain in left thigh: Secondary | ICD-10-CM | POA: Diagnosis not present

## 2022-06-26 DIAGNOSIS — M6281 Muscle weakness (generalized): Secondary | ICD-10-CM | POA: Diagnosis not present

## 2022-07-03 DIAGNOSIS — M6281 Muscle weakness (generalized): Secondary | ICD-10-CM | POA: Diagnosis not present

## 2022-07-11 DIAGNOSIS — M6281 Muscle weakness (generalized): Secondary | ICD-10-CM | POA: Diagnosis not present

## 2022-07-14 ENCOUNTER — Ambulatory Visit (HOSPITAL_BASED_OUTPATIENT_CLINIC_OR_DEPARTMENT_OTHER)
Admission: RE | Admit: 2022-07-14 | Discharge: 2022-07-14 | Disposition: A | Discharge status: Home / Self Care | Payer: Medicare Other | Source: Ambulatory Visit | Attending: Sports Medicine | Admitting: Sports Medicine

## 2022-07-14 ENCOUNTER — Other Ambulatory Visit: Payer: Self-pay | Admitting: Sports Medicine

## 2022-07-14 DIAGNOSIS — M5136 Other intervertebral disc degeneration, lumbar region: Secondary | ICD-10-CM | POA: Diagnosis not present

## 2022-07-14 DIAGNOSIS — M25552 Pain in left hip: Secondary | ICD-10-CM | POA: Insufficient documentation

## 2022-07-14 DIAGNOSIS — M25562 Pain in left knee: Secondary | ICD-10-CM | POA: Diagnosis not present

## 2022-07-14 DIAGNOSIS — M545 Low back pain, unspecified: Secondary | ICD-10-CM

## 2022-07-18 DIAGNOSIS — R2689 Other abnormalities of gait and mobility: Secondary | ICD-10-CM | POA: Diagnosis not present

## 2022-07-18 DIAGNOSIS — M25552 Pain in left hip: Secondary | ICD-10-CM | POA: Diagnosis not present

## 2022-07-29 DIAGNOSIS — M6281 Muscle weakness (generalized): Secondary | ICD-10-CM | POA: Diagnosis not present

## 2022-08-01 DIAGNOSIS — M25552 Pain in left hip: Secondary | ICD-10-CM | POA: Diagnosis not present

## 2022-08-01 DIAGNOSIS — M79652 Pain in left thigh: Secondary | ICD-10-CM | POA: Diagnosis not present

## 2022-08-01 DIAGNOSIS — M9905 Segmental and somatic dysfunction of pelvic region: Secondary | ICD-10-CM | POA: Diagnosis not present

## 2022-08-01 DIAGNOSIS — M9906 Segmental and somatic dysfunction of lower extremity: Secondary | ICD-10-CM | POA: Diagnosis not present

## 2022-08-06 ENCOUNTER — Ambulatory Visit (INDEPENDENT_AMBULATORY_CARE_PROVIDER_SITE_OTHER): Payer: Medicare Other

## 2022-08-06 VITALS — BP 138/82 | HR 64 | Wt 147.8 lb

## 2022-08-06 DIAGNOSIS — Z0000 Encounter for general adult medical examination without abnormal findings: Secondary | ICD-10-CM

## 2022-08-06 NOTE — Patient Instructions (Signed)
Sarah Frost , Thank you for taking time to come for your Medicare Wellness Visit. I appreciate your ongoing commitment to your health goals. Please review the following plan we discussed and let me know if I can assist you in the future.   Screening recommendations/referrals: Colonoscopy: done 06/10/18 repeat every 5 years  Mammogram: done 10/29/21 repeat every year  Bone Density: done 01/06/22 repeat every 2 year  Recommended yearly ophthalmology/optometry visit for glaucoma screening and checkup Recommended yearly dental visit for hygiene and checkup  Vaccinations: Influenza vaccine: done 09/16/21 repeat every year  Pneumococcal vaccine: Up to date Tdap vaccine: done 09/18/15 repeat every 10 years  Shingles vaccine: completed 8/31 & 10/29/21   Covid-19:completed 3/7, 3/30, 10/04/20 , 4/20 &  09/16/21  Advanced directives: Please bring a copy of your health care power of attorney and living will to the office at your convenience.  Conditions/risks identified: lose weight   Next appointment: Follow up in one year for your annual wellness visit    Preventive Care 65 Years and Older, Female Preventive care refers to lifestyle choices and visits with your health care provider that can promote health and wellness. What does preventive care include? A yearly physical exam. This is also called an annual well check. Dental exams once or twice a year. Routine eye exams. Ask your health care provider how often you should have your eyes checked. Personal lifestyle choices, including: Daily care of your teeth and gums. Regular physical activity. Eating a healthy diet. Avoiding tobacco and drug use. Limiting alcohol use. Practicing safe sex. Taking low-dose aspirin every day. Taking vitamin and mineral supplements as recommended by your health care provider. What happens during an annual well check? The services and screenings done by your health care provider during your annual well check will  depend on your age, overall health, lifestyle risk factors, and family history of disease. Counseling  Your health care provider may ask you questions about your: Alcohol use. Tobacco use. Drug use. Emotional well-being. Home and relationship well-being. Sexual activity. Eating habits. History of falls. Memory and ability to understand (cognition). Work and work Statistician. Reproductive health. Screening  You may have the following tests or measurements: Height, weight, and BMI. Blood pressure. Lipid and cholesterol levels. These may be checked every 5 years, or more frequently if you are over 87 years old. Skin check. Lung cancer screening. You may have this screening every year starting at age 37 if you have a 30-pack-year history of smoking and currently smoke or have quit within the past 15 years. Fecal occult blood test (FOBT) of the stool. You may have this test every year starting at age 76. Flexible sigmoidoscopy or colonoscopy. You may have a sigmoidoscopy every 5 years or a colonoscopy every 10 years starting at age 37. Hepatitis C blood test. Hepatitis B blood test. Sexually transmitted disease (STD) testing. Diabetes screening. This is done by checking your blood sugar (glucose) after you have not eaten for a while (fasting). You may have this done every 1-3 years. Bone density scan. This is done to screen for osteoporosis. You may have this done starting at age 46. Mammogram. This may be done every 1-2 years. Talk to your health care provider about how often you should have regular mammograms. Talk with your health care provider about your test results, treatment options, and if necessary, the need for more tests. Vaccines  Your health care provider may recommend certain vaccines, such as: Influenza vaccine. This is recommended every  year. Tetanus, diphtheria, and acellular pertussis (Tdap, Td) vaccine. You may need a Td booster every 10 years. Zoster vaccine. You may  need this after age 29. Pneumococcal 13-valent conjugate (PCV13) vaccine. One dose is recommended after age 49. Pneumococcal polysaccharide (PPSV23) vaccine. One dose is recommended after age 50. Talk to your health care provider about which screenings and vaccines you need and how often you need them. This information is not intended to replace advice given to you by your health care provider. Make sure you discuss any questions you have with your health care provider. Document Released: 01/11/2016 Document Revised: 09/03/2016 Document Reviewed: 10/16/2015 Elsevier Interactive Patient Education  2017 [place] Prevention in the Home Falls can cause injuries. They can happen to people of all ages. There are many things you can do to make your home safe and to help prevent falls. What can I do on the outside of my home? Regularly fix the edges of walkways and driveways and fix any cracks. Remove anything that might make you trip as you walk through a door, such as a raised step or threshold. Trim any bushes or trees on the path to your home. Use bright outdoor lighting. Clear any walking paths of anything that might make someone trip, such as rocks or tools. Regularly check to see if handrails are loose or broken. Make sure that both sides of any steps have handrails. Any raised decks and porches should have guardrails on the edges. Have any leaves, snow, or ice cleared regularly. Use sand or salt on walking paths during winter. Clean up any spills in your garage right away. This includes oil or grease spills. What can I do in the bathroom? Use night lights. Install grab bars by the toilet and in the tub and shower. Do not use towel bars as grab bars. Use non-skid mats or decals in the tub or shower. If you need to sit down in the shower, use a plastic, non-slip stool. Keep the floor dry. Clean up any water that spills on the floor as soon as it happens. Remove soap buildup in  the tub or shower regularly. Attach bath mats securely with double-sided non-slip rug tape. Do not have throw rugs and other things on the floor that can make you trip. What can I do in the bedroom? Use night lights. Make sure that you have a light by your bed that is easy to reach. Do not use any sheets or blankets that are too big for your bed. They should not hang down onto the floor. Have a firm chair that has side arms. You can use this for support while you get dressed. Do not have throw rugs and other things on the floor that can make you trip. What can I do in the kitchen? Clean up any spills right away. Avoid walking on wet floors. Keep items that you use a lot in easy-to-reach places. If you need to reach something above you, use a strong step stool that has a grab bar. Keep electrical cords out of the way. Do not use floor polish or wax that makes floors slippery. If you must use wax, use non-skid floor wax. Do not have throw rugs and other things on the floor that can make you trip. What can I do with my stairs? Do not leave any items on the stairs. Make sure that there are handrails on both sides of the stairs and use them. Fix handrails that are broken or  loose. Make sure that handrails are as long as the stairways. Check any carpeting to make sure that it is firmly attached to the stairs. Fix any carpet that is loose or worn. Avoid having throw rugs at the top or bottom of the stairs. If you do have throw rugs, attach them to the floor with carpet tape. Make sure that you have a light switch at the top of the stairs and the bottom of the stairs. If you do not have them, ask someone to add them for you. What else can I do to help prevent falls? Wear shoes that: Do not have high heels. Have rubber bottoms. Are comfortable and fit you well. Are closed at the toe. Do not wear sandals. If you use a stepladder: Make sure that it is fully opened. Do not climb a closed  stepladder. Make sure that both sides of the stepladder are locked into place. Ask someone to hold it for you, if possible. Clearly mark and make sure that you can see: Any grab bars or handrails. First and last steps. Where the edge of each step is. Use tools that help you move around (mobility aids) if they are needed. These include: Canes. Walkers. Scooters. Crutches. Turn on the lights when you go into a dark area. Replace any light bulbs as soon as they burn out. Set up your furniture so you have a clear path. Avoid moving your furniture around. If any of your floors are uneven, fix them. If there are any pets around you, be aware of where they are. Review your medicines with your doctor. Some medicines can make you feel dizzy. This can increase your chance of falling. Ask your doctor what other things that you can do to help prevent falls. This information is not intended to replace advice given to you by your health care provider. Make sure you discuss any questions you have with your health care provider. Document Released: 10/11/2009 Document Revised: 05/22/2016 Document Reviewed: 01/19/2015 Elsevier Interactive Patient Education  2017 [employer].

## 2022-08-06 NOTE — Progress Notes (Signed)
Subjective:   Sarah Frost is a 66 y.o. female who presents for an Initial Medicare Annual Wellness Visit.  Review of Systems     Cardiac Risk Factors include: dyslipidemia;hypertension;advanced age (>88mn, >>34women)     Objective:    Today's Vitals   08/06/22 1121  BP: 138/82  Pulse: 64  SpO2: 100%  Weight: 147 lb 12.8 oz (67 kg)   Body mass index is 24.22 kg/m.     08/06/2022   11:37 AM  Advanced Directives  Does Patient Have a Medical Advance Directive? Yes  Type of Advance Directive [place] Chart? No - copy requested    Current Medications (verified) Outpatient Encounter Medications as of 08/06/2022  Medication Sig   calcium carbonate (OSCAL) 1500 (600 Ca) MG TABS tablet 1 tablet with meals   ELDERBERRY PO Elderberry   estradiol (ESTRACE) 0.1 MG/GM vaginal cream 2x weekly   loratadine (CLARITIN) 10 MG tablet 1 tablet   magnesium citrate SOLN Take 296 mLs by mouth once.   triamterene-hydrochlorothiazide (MAXZIDE-25) 37.5-25 MG tablet Take 1 tablet by mouth daily.   [DISCONTINUED] benzonatate (TESSALON) 100 MG capsule Take 2 capsules (200 mg total) by mouth 2 (two) times daily as needed for cough.   [DISCONTINUED] fluticasone (FLONASE) 50 MCG/ACT nasal spray Place 2 sprays into both nostrils 2 (two) times daily.   No facility-administered encounter medications on file as of 08/06/2022.    Allergies (verified) Penicillins   History: Past Medical History:  Diagnosis Date   Allergy    Arthritis 2014   Chicken pox    Colon polyps    History of cystocele 10/30/2020   Hyperlipidemia, unspecified    Hypertension 2012   Migraine 10/30/2020   Ovarian cyst    Squamous cell skin cancer, face    Past Surgical History:  Procedure Laterality Date   OOPHORECTOMY Right 2004   large cyst of ovary-benign   Family History  Problem Relation Age of Onset   Hyperlipidemia Mother    Arthritis Mother     Heart disease Mother    Hypertension Mother    Vision loss Mother    Varicose Veins Mother    Hyperlipidemia Father    Arthritis Father    Heart disease Father    Hypertension Father    Heart attack Father    Arthritis Brother    Hyperlipidemia Brother    Hypertension Brother    Healthy Daughter        [place]  Drug abuse Son    Colon cancer Maternal Aunt    Hyperlipidemia Maternal Grandmother    Hypertension Maternal Grandmother    Heart attack Maternal Grandmother    Heart disease Maternal Grandmother    Colon cancer Maternal Grandfather    Hyperlipidemia Paternal Grandmother    Hypertension Paternal Grandmother    Heart attack Paternal Grandmother    Heart disease Paternal Grandmother    Hypertension Paternal Grandfather    Hyperlipidemia Paternal Grandfather    Heart attack Paternal Grandfather    Heart disease Paternal Grandfather    Social History   Socioeconomic History   Marital status: Married    Spouse name: Not on file   Number of children: Not on file   Years of education: Not on file   Highest education level: Not on file  Occupational History   Not on file  Tobacco Use   Smoking status: Never  Passive exposure: Never   Smokeless tobacco: Never  Vaping Use   Vaping Use: Never used  Substance and Sexual Activity   Alcohol use: Yes    Alcohol/week: 8.0 standard drinks of alcohol    Types: 8 Glasses of wine per week   Drug use: Never   Sexual activity: Not Currently    Partners: Male    Birth control/protection: None  Other Topics Concern   Not on file  Social History Narrative   Marital status/children/pets: Married   Education/employment: B.A., employed in  Hospital doctor:      -Wears a bicycle helmet riding a bike: Yes     -smoke alarm in the home:Yes     - wears seatbelt: Yes     - Feels safe in their relationships: Yes      Social Determinants of Health   Financial Resource Strain: Low Risk   (08/06/2022)   Overall Financial Resource Strain (CARDIA)    Difficulty of Paying Living Expenses: Not hard at all  Food Insecurity: No Food Insecurity (08/06/2022)   Hunger Vital Sign    Worried About Running Out of Food in the Last Year: Never true    [place] in the Last Year: Never true  Transportation Needs: No Transportation Needs (08/06/2022)   PRAPARE - Hydrologist (Medical): No    Lack of Transportation (Non-Medical): No  Physical Activity: Sufficiently Active (08/06/2022)   Exercise Vital Sign    Days of Exercise per Week: 4 days    Minutes of Exercise per Session: 60 min  Stress: No Stress Concern Present (08/06/2022)   [place]    Feeling of Stress : Not at all  Social Connections: Moderately Integrated (08/06/2022)   Social Connection and Isolation Panel [NHANES]    Frequency of Communication with Friends and Family: More than three times a week    Frequency of Social Gatherings with Friends and Family: More than three times a week    Attends Religious Services: Never    Marine scientist or Organizations: Yes    Attends Music therapist: 1 to 4 times per year    Marital Status: Married    Tobacco Counseling Counseling given: Not Answered   Clinical Intake:  Pre-visit preparation completed: Yes  Pain : No/denies pain     BMI - recorded: 24.22 Nutritional Status: BMI of 19-24  Normal Nutritional Risks: None Diabetes: No  How often do you need to have someone help you when you read instructions, pamphlets, or other written materials from your doctor or pharmacy?: 1 - Never  Diabetic?no  Interpreter Needed?: No  Information entered by :: Charlott Rakes, LPN   Activities of Daily Living    08/06/2022   11:39 AM  In your present state of health, do you have any difficulty performing the following activities:  Hearing? 0  Vision? 0  Difficulty  concentrating or making decisions? 0  Walking or climbing stairs? 0  Dressing or bathing? 0  Doing errands, shopping? 0  Preparing Food and eating ? N  Using the Toilet? N  In the past six months, have you accidently leaked urine? N  Do you have problems with loss of bowel control? N  Managing your Medications? N  Managing your Finances? N  Housekeeping or managing your Housekeeping? N    Patient Care Team: Ma Hillock, DO as PCP - General (Family  Medicine) [facility] as Consulting Physician (Dermatology) Royston Sinner, Colin Benton, MD as Consulting Physician (Obstetrics and Gynecology) Stephannie Li, [place] (Ophthalmology) Ronnette Juniper, MD as Consulting Physician (Gastroenterology) [facility] as Consulting Physician (Peripheral Vascular Disease) Gerda Diss, DO as Referring Physician (Sports Medicine)  Indicate any recent Medical Services you may have received from other than [facility] providers in the past year (date may be approximate).     Assessment:   This is a routine wellness examination for Sarah Frost.  Hearing/Vision screen Hearing Screening - Comments:: Pt denies any hearing issues  Vision Screening - Comments:: Pt follows up with Dr Sharren Bridge for annual eye exams   Dietary issues and exercise activities discussed: Current Exercise Habits: Home exercise routine, Type of exercise: walking;strength training/weights;Other - see comments, Time (Minutes): 60, Frequency (Times/Week): 4, Weekly Exercise (Minutes/Week): 240   Goals Addressed             This Visit's Progress    Patient Stated       Lose weight        Depression Screen    08/06/2022   11:35 AM 04/09/2022    8:46 AM  PHQ 2/9 Scores  PHQ - 2 Score 0 0    Fall Risk    08/06/2022   11:39 AM 04/09/2022    8:46 AM  [place] in the past year? 0 0  Number falls in past yr: 0 0  Injury with Fall? 0 0  Risk for fall due to : Impaired vision;Impaired balance/gait No Fall  Risks  Risk for fall due to: Comment working with PT and sport dr   Follow up Falls prevention discussed Falls evaluation completed    [place]:  Any stairs in or around the home? Yes  If so, are there any without handrails? No  Home free of loose throw rugs in walkways, pet beds, electrical cords, etc? Yes  Adequate lighting in your home to reduce risk of falls? Yes   ASSISTIVE DEVICES UTILIZED TO PREVENT FALLS:  Life alert? No  Use of a cane, walker or w/c? No  Grab bars in the bathroom? No  Shower chair or bench in shower? Yes  Elevated toilet seat or a handicapped toilet? No   TIMED UP AND GO:  Was the test performed? Yes .  Length of time to ambulate 10 feet: 10 sec.   Gait steady and fast without use of assistive device  Cognitive Function:        08/06/2022   11:41 AM  6CIT Screen  What Year? 0 points  What month? 0 points  What time? 0 points  Count back from 20 0 points  Months in reverse 0 points  Repeat phrase 0 points  Total Score 0 points    Immunizations Immunization History  Administered Date(s) Administered   Influenza Split 11/01/2018, 09/16/2021   Influenza,inj,Quad PF,6+ Mos 10/20/2016, 11/01/2018, 10/04/2020   Influenza,inj,quad, With Preservative 09/18/2015   PFIZER(Purple Top)SARS-COV-2 Vaccination 03/04/2020, 03/27/2020, 10/04/2020, 04/17/2021, 09/16/2021   PNEUMOCOCCAL CONJUGATE-20 08/01/2021   Tdap 09/18/2015   Zoster Recombinat (Shingrix) 08/28/2021, 10/29/2021   Zoster, Live 08/28/2021, 10/29/2021    TDAP status: Up to date  Flu Vaccine status: Up to date  Pneumococcal vaccine status: Up to date  Covid-19 vaccine status: Completed vaccines  Qualifies for Shingles Vaccine? Yes   Zostavax completed Yes   Shingrix Completed?: Yes  Screening Tests Health Maintenance  Topic Date Due   COVID-19 Vaccine (  6 - Pfizer series) 11/11/2021   INFLUENZA VACCINE  07/29/2022   COLONOSCOPY (Pts 45-64yr  Insurance coverage will need to be confirmed)  06/11/2023   MAMMOGRAM  10/30/2023   DEXA SCAN  01/06/2025   TETANUS/TDAP  09/17/2025   Pneumonia Vaccine 66 Years old  Completed   Hepatitis C Screening  Completed   Zoster Vaccines- Shingrix  Completed   HPV VACCINES  Aged Out    Health Maintenance  Health Maintenance Due  Topic Date Due   COVID-19 Vaccine (6 - Pfizer series) 11/11/2021   INFLUENZA VACCINE  07/29/2022    Colorectal cancer screening: Type of screening: Colonoscopy. Completed 06/10/18. Repeat every 5 years  Mammogram status: Completed 10/29/21. Repeat every year  Bone Density status: Completed 01/06/22. Results reflect: Bone density results: OSTEOPENIA. Repeat every 2 years.  Additional Screening:  Hepatitis C Screening:  Completed 08/10/21  Vision Screening: Recommended annual ophthalmology exams for early detection of glaucoma and other disorders of the eye. Is the patient up to date with their annual eye exam?  Yes  Who is the provider or what is the name of the office in which the patient attends annual eye exams? Dr MSharren Bridge If pt is not established with a provider, would they like to be referred to a provider to establish care? No .   Dental Screening: Recommended annual dental exams for proper oral hygiene  Community Resource Referral / Chronic Care Management: CRR required this visit?  No   CCM required this visit?  No      Plan:     I have personally reviewed and noted the following in the patient's chart:   Medical and social history Use of alcohol, tobacco or illicit drugs  Current medications and supplements including opioid prescriptions. Patient is not currently taking opioid prescriptions. Functional ability and status Nutritional status Physical activity Advanced directives List of other physicians Hospitalizations, surgeries, and ER visits in previous 12 months Vitals Screenings to include cognitive, depression, and falls Referrals  and appointments  In addition, I have reviewed and discussed with patient certain preventive protocols, quality metrics, and best practice recommendations. A written personalized care plan for preventive services as well as general preventive health recommendations were provided to patient.     TWillette Brace LPN   [id number]  Nurse Notes: none

## 2022-08-28 DIAGNOSIS — M25552 Pain in left hip: Secondary | ICD-10-CM | POA: Diagnosis not present

## 2022-08-28 DIAGNOSIS — M9905 Segmental and somatic dysfunction of pelvic region: Secondary | ICD-10-CM | POA: Diagnosis not present

## 2022-08-28 DIAGNOSIS — M79652 Pain in left thigh: Secondary | ICD-10-CM | POA: Diagnosis not present

## 2022-08-28 DIAGNOSIS — M9904 Segmental and somatic dysfunction of sacral region: Secondary | ICD-10-CM | POA: Diagnosis not present

## 2022-08-28 DIAGNOSIS — M9903 Segmental and somatic dysfunction of lumbar region: Secondary | ICD-10-CM | POA: Diagnosis not present

## 2022-08-28 DIAGNOSIS — M9908 Segmental and somatic dysfunction of rib cage: Secondary | ICD-10-CM | POA: Diagnosis not present

## 2022-08-28 DIAGNOSIS — M9906 Segmental and somatic dysfunction of lower extremity: Secondary | ICD-10-CM | POA: Diagnosis not present

## 2022-09-08 ENCOUNTER — Encounter: Payer: Self-pay | Admitting: Family Medicine

## 2022-09-08 ENCOUNTER — Ambulatory Visit (INDEPENDENT_AMBULATORY_CARE_PROVIDER_SITE_OTHER): Payer: Medicare Other | Admitting: Family Medicine

## 2022-09-08 VITALS — BP 148/80 | HR 66 | Temp 98.2°F | Ht 65.5 in | Wt 146.0 lb

## 2022-09-08 DIAGNOSIS — L0100 Impetigo, unspecified: Secondary | ICD-10-CM | POA: Diagnosis not present

## 2022-09-08 DIAGNOSIS — L989 Disorder of the skin and subcutaneous tissue, unspecified: Secondary | ICD-10-CM | POA: Diagnosis not present

## 2022-09-08 MED ORDER — DOXYCYCLINE HYCLATE 100 MG PO TABS: 100.0000 mg | ORAL_TABLET | Freq: Two times a day (BID) | ORAL | 0 refills | Status: DC

## 2022-09-08 MED ORDER — MUPIROCIN 2 % EX OINT: 1.0000 | TOPICAL_OINTMENT | Freq: Three times a day (TID) | CUTANEOUS | 0 refills | Status: DC

## 2022-09-08 NOTE — Progress Notes (Signed)
Sarah Frost , November 14, 1956, 66 y.o., female MRN: [id number] Patient Care Team    Relationship Specialty Notifications Start End  Ma Hillock, DO PCP - General Family Medicine  04/09/22   [facility] Consulting Physician Dermatology  04/09/22   Tyson Dense, MD Consulting Physician Obstetrics and Gynecology  04/09/22   Stephannie Li, [place]  Ophthalmology  04/09/22   Ronnette Juniper, MD Consulting Physician Gastroenterology  04/09/22   [facility] Consulting Physician Peripheral Vascular Disease  04/09/22   Gerda Diss, DO Referring Physician Sports Medicine  04/09/22     Chief Complaint  Patient presents with   Rash    Pt report a spot on R upper lip area that is red and sore x 2 mos;      Subjective: Pt presents for an OV with complaints of redness and soreness near her left nare opening. Pt reports it has been present for 2 months. She has tried different topical creams that can calm it down some, but original sore remains.       08/06/2022   11:35 AM 04/09/2022    8:46 AM  Depression screen PHQ 2/9  Decreased Interest 0 0  Down, Depressed, Hopeless 0 0  PHQ - 2 Score 0 0    Allergies  Allergen Reactions   Penicillins Hives   Social History   Social History Narrative   Marital status/children/pets: Married   Education/employment: B.A., employed in  Hospital doctor:      -Wears a bicycle helmet riding a bike: Yes     -smoke alarm in the home:Yes     - wears seatbelt: Yes     - Feels safe in their relationships: Yes      Past Medical History:  Diagnosis Date   Allergy    Arthritis 2014   Chicken pox    Colon polyps    History of cystocele 10/30/2020   Hyperlipidemia, unspecified    Hypertension 2012   Migraine 10/30/2020   Ovarian cyst    Squamous cell skin cancer, face    Past Surgical History:  Procedure Laterality Date   OOPHORECTOMY Right 2004   large cyst of ovary-benign   Family History  Problem  Relation Age of Onset   Hyperlipidemia Mother    Arthritis Mother    Heart disease Mother    Hypertension Mother    Vision loss Mother    Varicose Veins Mother    Hyperlipidemia Father    Arthritis Father    Heart disease Father    Hypertension Father    Heart attack Father    Arthritis Brother    Hyperlipidemia Brother    Hypertension Brother    Healthy Daughter        [place], [place] and [place]   Drug abuse Son    Colon cancer Maternal Aunt    Hyperlipidemia Maternal Grandmother    Hypertension Maternal Grandmother    Heart attack Maternal Grandmother    Heart disease Maternal Grandmother    Colon cancer Maternal Grandfather    Hyperlipidemia Paternal Grandmother    Hypertension Paternal Grandmother    Heart attack Paternal Grandmother    Heart disease Paternal Grandmother    Hypertension Paternal Grandfather    Hyperlipidemia Paternal Grandfather    Heart attack Paternal Grandfather    Heart disease Paternal Grandfather    Allergies as of 09/08/2022       Reactions   Penicillins Hives  Medication List        Accurate as of September 08, 2022 10:56 AM. If you have any questions, ask your nurse or doctor.          calcium carbonate 1500 (600 Ca) MG Tabs tablet Commonly known as: OSCAL 1 tablet with meals   ELDERBERRY PO Elderberry   estradiol 0.1 MG/GM vaginal cream Commonly known as: ESTRACE 2x weekly   loratadine 10 MG tablet Commonly known as: CLARITIN 1 tablet   magnesium citrate Soln Take 296 mLs by mouth once.   triamterene-hydrochlorothiazide 37.5-25 MG tablet Commonly known as: MAXZIDE-25 Take 1 tablet by mouth daily.        All past medical history, surgical history, allergies, family history, immunizations andmedications were updated in the EMR today and reviewed under the history and medication portions of their EMR.     ROS Negative, with the exception of above mentioned in HPI   Objective:  BP (!) 148/80   Pulse  66   Temp 98.2 F (36.8 C) (Oral)   Ht 5' 5.5" (1.664 m)   Wt 146 lb (66.2 kg)   SpO2 100%   BMI 23.93 kg/m  Body mass index is 23.93 kg/m. Physical Exam Vitals and nursing note reviewed.  Constitutional:      General: She is not in acute distress.    Appearance: Normal appearance. She is normal weight. She is not ill-appearing or toxic-appearing.  HENT:     Nose: Nasal tenderness present.   Eyes:     Extraocular Movements: Extraocular movements intact.     Conjunctiva/sclera: Conjunctivae normal.     Pupils: Pupils are equal, round, and reactive to light.  Skin:    Findings: Erythema present. No bruising.  Neurological:     Mental Status: She is alert and oriented to person, place, and time. Mental status is at baseline.  Psychiatric:        Mood and Affect: Mood normal.        Behavior: Behavior normal.        Thought Content: Thought content normal.        Judgment: Judgment normal.      No results found. No results found. No results found for this or any previous visit (from the past 24 hour(s)).  Assessment/Plan: Sarah Frost is a 66 y.o. female present for OV for  Impetigo/External nasal lesion Area appears consistent with impetigo.  Will try doxy BID abd Bactroban ointment for 7 days  She has cpe in a few weeks, can recheck then.  She also has her yearly derm appt schedule for her h/o skin cancer  Reviewed expectations re: course of current medical issues. Discussed self-management of symptoms. Outlined signs and symptoms indicating need for more acute intervention. Patient verbalized understanding and all questions were answered. Patient received an After-Visit Summary.    No orders of the defined types were placed in this encounter.  No orders of the defined types were placed in this encounter.  Referral Orders  No referral(s) requested today     Note is dictated utilizing voice recognition software. Although note has been proof read prior  to signing, occasional typographical errors still can be missed. If any questions arise, please do not hesitate to call for verification.   electronically signed by:  Howard Pouch, DO  [place]

## 2022-09-08 NOTE — Patient Instructions (Signed)
Impetigo, Adult Impetigo is an infection of the skin. It commonly occurs in young children, but it can also occur in adults. The infection causes itchy blisters and sores that produce brownish-yellow fluid. As the fluid dries, it forms a thick, honey-colored crust. These skin changes usually occur on the face, but they can also affect other areas of the body. Impetigo usually goes away in 7-10 days with treatment. What are the causes? This condition is caused by two types of bacteria. It may be caused by staphylococci or streptococci bacteria. These bacteria cause impetigo when they get under the surface of the skin. This often happens after some damage to the skin, such as: Cuts, scrapes, or scratches. Rashes. Insect bites, especially when you scratch the area of a bite. Chickenpox or other illnesses that cause open skin sores. Nail biting or chewing. Impetigo can spread easily from one person to another (is contagious). It may be spread through close skin contact or by sharing towels, clothing, or other items that an infected person has touched. Scratching the affected area can cause impetigo to spread to other parts of the body. The bacteria can get under your fingernails and spread when you touch another area of your skin. What increases the risk? The following factors may make you more likely to develop this condition: Playing sports that include skin-to-skin contact with others. Having broken skin, such as from a cut or scrape. Living in an area that has high humidity levels. Having poor hygiene. Having high levels of staphylococci in your nose. Having a condition that weakens the skin integrity, such as: Having a weak body defense system (immune system). Having a skin condition with open sores, such as chickenpox. Having diabetes. What are the signs or symptoms? The main symptom of this condition is small blisters, often on the face around the mouth and nose. In time, the blisters break  open and turn into tiny sores (lesions) with a yellow crust. In some cases, the blisters cause itching or burning. Scratching, irritation, or lack of treatment may cause these small lesions to get larger. Other possible symptoms include: Larger blisters. Pus. Swollen lymph glands. How is this diagnosed? This condition is usually diagnosed during a physical exam. A skin sample or a sample of fluid from a blister may be taken for lab tests that involve growing bacteria (culture test). Lab tests can help confirm the diagnosis or help determine the best treatment. How is this treated? Treatment for this condition depends on the severity of the condition: Mild impetigo can be treated with prescription antibiotic cream. Oral antibiotic medicine may be used in more severe cases. Medicines that reduce itchiness (antihistamines)may also be used. Follow these instructions at home: Medicines Take over-the-counter and prescription medicines only as told by your health care provider. Apply or take your antibiotic as told by your health care provider. Do not stop using the antibiotic even if your condition improves. Before applying antibiotic cream or ointment, you should: Gently wash the infected areas with antibacterial soap and warm water. Soak crusted areas in warm, soapy water using antibacterial soap. Gently rub the areas to remove crusts. Do not scrub. Preventing the spread of infection  To help prevent impetigo from spreading to other body areas: Keep your fingernails short and clean. Do not scratch the blisters or sores. Cover infected areas, if necessary, to keep from scratching. Wash your hands often with soap and warm water. To help prevent impetigo from spreading to other people: Do not share towels.  Wash your clothing and bedsheets in water that is 140F (60C) or warmer. Stay home until you have used an antibiotic cream for 48 hours (2 days) or an oral antibiotic medicine for 24 hours  (1 day). You should only return to work and activities with other people if your skin shows significant improvement. You may return to contact sports after you have used antibiotic medicine for 72 hours (3 days). General instructions Keep all follow-up visits. This is important. How is this prevented? Wash your hands often with soap and warm water. Do not share towels, washcloths, clothing, bedding, or razors. Keep your fingernails short. Keep any cuts, scrapes, bug bites, or rashes clean and covered. Use insect repellent to prevent bug bites. Contact a health care provider if: You develop more blisters or sores, even with treatment. Other family members get sores. Your skin sores are not improving after 72 hours (3 days) of treatment. You have a fever. Get help right away if: You see spreading redness or swelling of the skin around your sores. You develop a sore throat. The area around your rash becomes warm, red, or tender to the touch. You have dark, reddish-brown urine. You do not urinate often or you urinate small amounts. You are very tired (lethargic). You have swelling in your face, hands, or feet. Summary Impetigo is a skin infection that causes itchy blisters and sores that produce brownish-yellow fluid. As the fluid dries, it forms a crust. This condition is caused by staphylococci or streptococci bacteria. These bacteria cause impetigo when they get under the surface of the skin, such as through cuts, rashes, bug bites, or open sores. Treatment for this condition may include antibiotic ointment or oral antibiotics. To help prevent impetigo from spreading to other body areas, make sure you keep your fingernails short, avoid scratching, cover any blisters, and wash your hands often. If you have impetigo, stay home until you have used an antibiotic cream for 48 hours (2 days) or an oral antibiotic medicine for 24 hours (1 day). You should only return to work and activities with  other people if your skin shows significant improvement. This information is not intended to replace advice given to you by your health care provider. Make sure you discuss any questions you have with your health care provider. Document Revised: 05/16/2020 Document Reviewed: 05/16/2020 Elsevier Patient Education  [place].

## 2022-09-12 ENCOUNTER — Encounter: Payer: Self-pay | Admitting: Family Medicine

## 2022-09-12 NOTE — Telephone Encounter (Signed)
Please advise 

## 2022-09-12 NOTE — Telephone Encounter (Signed)
She can discontinue the ointment and just use the antibiotic.  If it does not respond and heal, then she will need to be seen by her dermatologist to rule out potential skin cancer.

## 2022-09-17 ENCOUNTER — Encounter: Payer: Self-pay | Admitting: Family Medicine

## 2022-09-17 ENCOUNTER — Ambulatory Visit (INDEPENDENT_AMBULATORY_CARE_PROVIDER_SITE_OTHER): Payer: Medicare Other | Admitting: Family Medicine

## 2022-09-17 ENCOUNTER — Ambulatory Visit: Payer: Medicare Other | Admitting: Family Medicine

## 2022-09-17 VITALS — BP 143/83 | HR 58 | Temp 97.6°F | Ht 65.5 in | Wt 146.0 lb

## 2022-09-17 DIAGNOSIS — I10 Essential (primary) hypertension: Secondary | ICD-10-CM | POA: Diagnosis not present

## 2022-09-17 DIAGNOSIS — E782 Mixed hyperlipidemia: Secondary | ICD-10-CM | POA: Diagnosis not present

## 2022-09-17 DIAGNOSIS — Z23 Encounter for immunization: Secondary | ICD-10-CM | POA: Diagnosis not present

## 2022-09-17 DIAGNOSIS — M8580 Other specified disorders of bone density and structure, unspecified site: Secondary | ICD-10-CM | POA: Diagnosis not present

## 2022-09-17 DIAGNOSIS — Z1231 Encounter for screening mammogram for malignant neoplasm of breast: Secondary | ICD-10-CM | POA: Diagnosis not present

## 2022-09-17 DIAGNOSIS — E559 Vitamin D deficiency, unspecified: Secondary | ICD-10-CM

## 2022-09-17 DIAGNOSIS — Z79899 Other long term (current) drug therapy: Secondary | ICD-10-CM

## 2022-09-17 DIAGNOSIS — Z8249 Family history of ischemic heart disease and other diseases of the circulatory system: Secondary | ICD-10-CM | POA: Diagnosis not present

## 2022-09-17 LAB — LIPID PANEL
Cholesterol: 249 mg/dL — ABNORMAL HIGH (ref 0–200)
HDL: 101.1 mg/dL (ref >39.00)
LDL Cholesterol: 136 mg/dL — ABNORMAL HIGH (ref 0–99)
NonHDL: 147.43
Total CHOL/HDL Ratio: 2
Triglycerides: 57 mg/dL (ref 0.0–149.0)
VLDL: 11.4 mg/dL (ref 0.0–40.0)

## 2022-09-17 LAB — COMPREHENSIVE METABOLIC PANEL
ALT: 15 U/L (ref 0–35)
AST: 21 U/L (ref 0–37)
Albumin: 4.3 g/dL (ref 3.5–5.2)
Alkaline Phosphatase: 55 U/L (ref 39–117)
BUN: 8 mg/dL (ref 6–23)
CO2: 30 mEq/L (ref 19–32)
Calcium: 9.6 mg/dL (ref 8.4–10.5)
Chloride: 96 mEq/L (ref 96–112)
Creatinine, Ser: 0.53 mg/dL (ref 0.40–1.20)
GFR: 96.21 mL/min (ref >60.00)
Glucose, Bld: 86 mg/dL (ref 70–99)
Potassium: 4 mEq/L (ref 3.5–5.1)
Sodium: 133 mEq/L — ABNORMAL LOW (ref 135–145)
Total Bilirubin: 0.7 mg/dL (ref 0.2–1.2)
Total Protein: 7.2 g/dL (ref 6.0–8.3)

## 2022-09-17 LAB — CBC
HCT: 39.4 % (ref 36.0–46.0)
Hemoglobin: 13.1 g/dL (ref 12.0–15.0)
MCHC: 33.2 g/dL (ref 30.0–36.0)
MCV: 97.5 fl (ref 78.0–100.0)
Platelets: 259 10*3/uL (ref 150.0–400.0)
RBC: 4.04 Mil/uL (ref 3.87–5.11)
RDW: 14.4 % (ref 11.5–15.5)
WBC: 5.5 10*3/uL (ref 4.0–10.5)

## 2022-09-17 LAB — TSH: TSH: 1.49 u[IU]/mL (ref 0.35–5.50)

## 2022-09-17 LAB — HEMOGLOBIN A1C: Hgb A1c MFr Bld: 5.3 % (ref 4.6–6.5)

## 2022-09-17 LAB — VITAMIN D 25 HYDROXY (VIT D DEFICIENCY, FRACTURES): VITD: 39.77 ng/mL (ref 30.00–100.00)

## 2022-09-17 NOTE — Progress Notes (Signed)
Patient ID: Sarah Frost, female  DOB: [birth year], 66 y.o.   MRN: [id number] Patient Care Team    Relationship Specialty Notifications Start End  Ma Hillock, DO PCP - General Family Medicine  04/09/22   [facility] Consulting Physician Dermatology  04/09/22   Tyson Dense, MD Consulting Physician Obstetrics and Gynecology  04/09/22   Stephannie Li, [place]  Ophthalmology  04/09/22   Ronnette Juniper, MD Consulting Physician Gastroenterology  04/09/22   [facility] Consulting Physician Peripheral Vascular Disease  04/09/22   Gerda Diss, DO Referring Physician Sports Medicine  04/09/22     Chief Complaint  Patient presents with   Hypertension    Cmc; pt is not fasting    Subjective: CHESLEY Frost is a 66 y.o.  female present for [facility] All past medical history, surgical history, allergies, family history, immunizations, medications and social history were updated in the electronic medical record today. All recent labs, ED visits and hospitalizations within the last year were reviewed.  HTN/HLD: Pt reports compliance  with triamterene-hydrochlorothiazide 37.5-25. Patient denies chest pain, shortness of breath, dizziness or lower extremity edema. Home pressures are normal.  Pt does not take a daily baby ASA. Pt is not prescribed statin. Diet: Low-sodium RF:fh heart disease, HTN, HLD  Osteopenia: T score -1.3 completed January 2023.  She does supplement with calcium and vitamin D.  She exercises routinely.  Estrogen deficiency: Patient reports she would like to continue her Estrace 0.1 mg/GM vaginal cream twice weekly.    08/06/2022   11:35 AM 04/09/2022    8:46 AM  Depression screen PHQ 2/9  Decreased Interest 0 0  Down, Depressed, Hopeless 0 0  PHQ - 2 Score 0 0       No data to display                09/07/2022   11:41 AM 08/06/2022   11:39 AM 04/09/2022    8:46 AM  Fall Risk   Falls in the past year? 0 0 0  Number falls in past yr:  0  0  Injury with Fall?  0 0  Risk for fall due to :  Impaired vision;Impaired balance/gait No Fall Risks  Risk for fall due to: Comment  working with PT and sport dr   Follow up  Falls prevention discussed Falls evaluation completed    Immunization History  Administered Date(s) Administered   Fluad Quad(high Dose 65+) 09/17/2022   Influenza Split 11/01/2018, 09/16/2021   Influenza,inj,Quad PF,6+ Mos 10/20/2016, 11/01/2018, 10/04/2020   Influenza,inj,quad, With Preservative 09/18/2015   PFIZER(Purple Top)SARS-COV-2 Vaccination 03/04/2020, 03/27/2020, 10/04/2020, 04/17/2021, 09/16/2021   PNEUMOCOCCAL CONJUGATE-20 08/01/2021   Tdap 09/18/2015   Zoster Recombinat (Shingrix) 08/28/2021, 10/29/2021   Zoster, Live 08/28/2021, 10/29/2021    No results found.  Past Medical History:  Diagnosis Date   Allergy    Arthritis 2014   Chicken pox    Colon polyps    History of cystocele 10/30/2020   Hyperlipidemia, unspecified    Hypertension 2012   Migraine 10/30/2020   Ovarian cyst    Squamous cell skin cancer, face    Allergies  Allergen Reactions   Penicillins Hives   Past Surgical History:  Procedure Laterality Date   OOPHORECTOMY Right 2004   large cyst of ovary-benign   Family History  Problem Relation Age of Onset   Hyperlipidemia Mother    Arthritis Mother    Heart disease Mother    Hypertension  Mother    Vision loss Mother    Varicose Veins Mother    Hyperlipidemia Father    Arthritis Father    Heart disease Father    Hypertension Father    Heart attack Father    Arthritis Brother    Hyperlipidemia Brother    Hypertension Brother    Healthy Daughter        [place], [place] and [place]   Drug abuse Son    Colon cancer Maternal Aunt    Hyperlipidemia Maternal Grandmother    Hypertension Maternal Grandmother    Heart attack Maternal Grandmother    Heart disease Maternal Grandmother    Colon cancer Maternal Grandfather    Hyperlipidemia Paternal Grandmother     Hypertension Paternal Grandmother    Heart attack Paternal Grandmother    Heart disease Paternal Grandmother    Hypertension Paternal Grandfather    Hyperlipidemia Paternal Grandfather    Heart attack Paternal Grandfather    Heart disease Paternal Grandfather    Social History   Social History Narrative   Marital status/children/pets: Married   Education/employment: B.A., employed in  Hospital doctor:      -Wears a bicycle helmet riding a bike: Yes     -smoke alarm in the home:Yes     - wears seatbelt: Yes     - Feels safe in their relationships: Yes       Allergies as of 09/17/2022       Reactions   Penicillins Hives        Medication List        Accurate as of September 17, 2022  1:00 PM. If you have any questions, ask your nurse or doctor.          STOP taking these medications    doxycycline 100 MG tablet Commonly known as: VIBRA-TABS Stopped by: Howard Pouch, DO   mupirocin ointment 2 % Commonly known as: BACTROBAN Stopped by: Howard Pouch, DO       TAKE these medications    calcium carbonate 1500 (600 Ca) MG Tabs tablet Commonly known as: OSCAL 1 tablet with meals   ELDERBERRY PO Elderberry   estradiol 0.1 MG/GM vaginal cream Commonly known as: ESTRACE 2x weekly   loratadine 10 MG tablet Commonly known as: CLARITIN 1 tablet   magnesium citrate Soln Take 296 mLs by mouth once.   triamterene-hydrochlorothiazide 37.5-25 MG tablet Commonly known as: MAXZIDE-25 Take 1 tablet by mouth daily.        All past medical history, surgical history, allergies, family history, immunizations andmedications were updated in the EMR today and reviewed under the history and medication portions of their EMR.    No results found for this or any previous visit (from the past 2160 hour(s)).  DG BONE DENSITY (DXA) Result Date: 01/06/2022 ASSESSMENT: The BMD measured at Femur Neck Left is 0.854 g/cm2 with a T-score of -1.3. This patient is  considered osteopenic/low bone mass according to [facility]) criteria.  FRAX* 10-year Probability of Fracture Based on femoral neck BMD: DualFemur (Left) Major Osteoporotic Fracture: 16.4%/ Hip Fracture: The probability of a hip fracture is 1.0% within the next ten years.    ROS 14 pt review of systems performed and negative (unless mentioned in an HPI)  Objective: BP (!) 143/83   Pulse (!) 58   Temp 97.6 F (36.4 C) (Oral)   Ht 5' 5.5" (1.664 m)   Wt 146 lb (66.2 kg)   SpO2 100%  BMI 23.93 kg/m  Physical Exam Vitals and nursing note reviewed.  Constitutional:      General: She is not in acute distress.    Appearance: Normal appearance. She is not ill-appearing, toxic-appearing or diaphoretic.  HENT:     Head: Normocephalic and atraumatic.  Eyes:     General: No scleral icterus.       Right eye: No discharge.        Left eye: No discharge.     Extraocular Movements: Extraocular movements intact.     Conjunctiva/sclera: Conjunctivae normal.     Pupils: Pupils are equal, round, and reactive to light.  Cardiovascular:     Rate and Rhythm: Normal rate and regular rhythm.     Heart sounds: No murmur heard. Pulmonary:     Effort: Pulmonary effort is normal. No respiratory distress.     Breath sounds: Normal breath sounds. No wheezing, rhonchi or rales.  Musculoskeletal:     Right lower leg: No edema.     Left lower leg: No edema.  Skin:    General: Skin is warm and dry.     Coloration: Skin is not jaundiced or pale.     Findings: No erythema or rash.  Neurological:     Mental Status: She is alert and oriented to person, place, and time. Mental status is at baseline.     Motor: No weakness.     Gait: Gait normal.  Psychiatric:        Mood and Affect: Mood normal.        Behavior: Behavior normal.        Thought Content: Thought content normal.        Judgment: Judgment normal.      Assessment/plan: BALINDA HEACOCK is a 66 y.o. female present for  est care/Establishing care with new doctor, encounter for Primary hypertension/Mixed hyperlipidemia/FH: heart disease Stable.  Her pressures are borderline here but reported pressures from other offices and home pressures are normal.  She will continue to monitor at home and if she notices pressures above 737 systolic she will come in to be seen so we can adjust medication.  She will also bring her blood pressure cuff with her to her next appointment so we can ensure for accuracy. Continue routine exercise and low-sodium diet. Continue triamterene-HCTZ Cbc, cmp, tsh,lipids collected today Discussed cardiac CT, and she would like to have coronary calcium score self-pay ordered for her. Follow-up in 5.5 months  Osteopenia, unspecified location Continue vitamin D and calcium supplement Continue routine exercise Limit caffeine and alcohol Vitamin D levels collected today DEXA up-to-date, next DEXA due 2025/2026  Decreased estrogen level Stable Continue estradiol 0.1- 2 times weekly  Need for influenza vaccination - Flu Vaccine QUAD High Dose(Fluad) Encounter for long-term current use of medication - Hemoglobin A1c Vitamin D deficiency - Vitamin D (25 hydroxy) Breast cancer screening by mammogram - MM 3D SCREEN BREAST BILATERAL; Future   No follow-ups on file.  Orders Placed This Encounter  Procedures   MM 3D SCREEN BREAST BILATERAL   CT CARDIAC SCORING (SELF PAY ONLY)   Flu Vaccine QUAD High Dose(Fluad)   CBC   Comp Met (CMET)   TSH   Lipid panel   Hemoglobin A1c   Vitamin D (25 hydroxy)   No orders of the defined types were placed in this encounter.  Referral Orders  No referral(s) requested today     Note is dictated utilizing voice recognition software. Although note has been proof read  prior to signing, occasional typographical errors still can be missed. If any questions arise, please do not hesitate to call for verification.  Electronically signed by: Howard Pouch, DO [place]

## 2022-09-17 NOTE — Patient Instructions (Signed)
No follow-ups on file.        Great to see you today.  I have refilled the medication(s) we provide.   If labs were collected, we will inform you of lab results once received either by echart message or telephone call.   - echart message- for normal results that have been seen by the patient already.   - telephone call: abnormal results or if patient has not viewed results in their echart.  

## 2022-09-24 ENCOUNTER — Ambulatory Visit: Payer: Medicare Other | Admitting: Family Medicine

## 2022-10-03 DIAGNOSIS — Z23 Encounter for immunization: Secondary | ICD-10-CM | POA: Diagnosis not present

## 2022-10-06 ENCOUNTER — Other Ambulatory Visit: Payer: Self-pay | Admitting: Family Medicine

## 2022-10-06 DIAGNOSIS — Z1231 Encounter for screening mammogram for malignant neoplasm of breast: Secondary | ICD-10-CM

## 2022-10-14 ENCOUNTER — Telehealth (INDEPENDENT_AMBULATORY_CARE_PROVIDER_SITE_OTHER): Payer: Medicare Other | Admitting: Family Medicine

## 2022-10-14 ENCOUNTER — Encounter: Payer: Self-pay | Admitting: Family Medicine

## 2022-10-14 VITALS — BP 119/71 | Temp 100.0°F | Wt 143.0 lb

## 2022-10-14 DIAGNOSIS — U071 COVID-19: Secondary | ICD-10-CM

## 2022-10-14 DIAGNOSIS — R051 Acute cough: Secondary | ICD-10-CM

## 2022-10-14 MED ORDER — MOLNUPIRAVIR EUA 200MG CAPSULE: 4.0000 | ORAL_CAPSULE | Freq: Two times a day (BID) | ORAL | 0 refills | Status: AC

## 2022-10-14 NOTE — Patient Instructions (Signed)

## 2022-10-14 NOTE — Progress Notes (Signed)
VIRTUAL VISIT VIA VIDEO  I connected with Sarah Frost on 10/14/22 at 11:00 AM EDT by a video enabled telemedicine application and verified that I am speaking with the correct person using two identifiers. Location patient: Home Location provider: [facility], Office Persons participating in the virtual visit: Patient, Dr. Raoul Pitch and V.Smith, Victoria Vera discussed the limitations of evaluation and management by telemedicine and the availability of in person appointments. The patient expressed understanding and agreed to proceed.     Sarah Frost , Jul 31, 1956, 66 y.o., female MRN: [id number] Patient Care Team    Relationship Specialty Notifications Start End  Ma Hillock, DO PCP - General Family Medicine  04/09/22   [facility] Consulting Physician Dermatology  04/09/22   Tyson Dense, MD Consulting Physician Obstetrics and Gynecology  04/09/22   Stephannie Li, Far[place]  Ophthalmology  04/09/22   Ronnette Juniper, MD Consulting Physician Gastroenterology  04/09/22   [facility] Consulting Physician Peripheral Vascular Disease  04/09/22   Gerda Diss, DO Referring Physician Sports Medicine  04/09/22     Chief Complaint  Patient presents with   Covid Positive    tested positive on 10/16 COVID vaccine on 10/06     Subjective: Pt presents for an OV with positive COVID test x2 yesterday.  She reports her husband became COVID positive last week.  She was out of town last week and he has been isolating, but she ended up noticing she had a cough and low-grade fever prior to testing. She has had her COVID vaccinations. Currently is not taking anything for symptoms.     08/06/2022   11:35 AM 04/09/2022    8:46 AM  Depression screen PHQ 2/9  Decreased Interest 0 0  Down, Depressed, Hopeless 0 0  PHQ - 2 Score 0 0    Allergies  Allergen Reactions   Penicillins Hives   Social History   Social History Narrative   Marital  status/children/pets: Married   Education/employment: B.A., employed in  Hospital doctor:      -Wears a bicycle helmet riding a bike: Yes     -smoke alarm in the home:Yes     - wears seatbelt: Yes     - Feels safe in their relationships: Yes      Past Medical History:  Diagnosis Date   Allergy    Arthritis 2014   Chicken pox    Colon polyps    History of cystocele 10/30/2020   Hyperlipidemia, unspecified    Hypertension 2012   Migraine 10/30/2020   Ovarian cyst    Squamous cell skin cancer, face    Past Surgical History:  Procedure Laterality Date   OOPHORECTOMY Right 2004   large cyst of ovary-benign   Family History  Problem Relation Age of Onset   Hyperlipidemia Mother    Arthritis Mother    Heart disease Mother    Hypertension Mother    Vision loss Mother    Varicose Veins Mother    Hyperlipidemia Father    Arthritis Father    Heart disease Father    Hypertension Father    Heart attack Father    Arthritis Brother    Hyperlipidemia Brother    Hypertension Brother    Healthy Daughter        [place], [place] and [place]   Drug abuse Son    Colon cancer Maternal Aunt    Hyperlipidemia Maternal Grandmother  Hypertension Maternal Grandmother    Heart attack Maternal Grandmother    Heart disease Maternal Grandmother    Colon cancer Maternal Grandfather    Hyperlipidemia Paternal Grandmother    Hypertension Paternal Grandmother    Heart attack Paternal Grandmother    Heart disease Paternal Grandmother    Hypertension Paternal Grandfather    Hyperlipidemia Paternal Grandfather    Heart attack Paternal Grandfather    Heart disease Paternal Grandfather    Allergies as of 10/14/2022       Reactions   Penicillins Hives        Medication List        Accurate as of October 14, 2022 11:33 AM. If you have any questions, ask your nurse or doctor.          calcium carbonate 1500 (600 Ca) MG Tabs tablet Commonly known as: OSCAL 1 tablet with  meals   ELDERBERRY PO Elderberry   estradiol 0.1 MG/GM vaginal cream Commonly known as: ESTRACE 2x weekly   loratadine 10 MG tablet Commonly known as: CLARITIN 1 tablet   magnesium citrate Soln Take 296 mLs by mouth once.   molnupiravir EUA 200 mg Caps capsule Commonly known as: LAGEVRIO Take 4 capsules (800 mg total) by mouth 2 (two) times daily for 5 days. Started by: Howard Pouch, DO   triamterene-hydrochlorothiazide 37.5-25 MG tablet Commonly known as: MAXZIDE-25 Take 1 tablet by mouth daily.        All past medical history, surgical history, allergies, family history, immunizations andmedications were updated in the EMR today and reviewed under the history and medication portions of their EMR.     Review of Systems  Constitutional:  Positive for fever and malaise/fatigue. Negative for chills and diaphoresis.  HENT:  Positive for congestion. Negative for ear pain and sore throat.   Respiratory:  Positive for cough. Negative for sputum production, shortness of breath and wheezing.   Gastrointestinal:  Negative for constipation and diarrhea.  Skin:  Negative for rash.   Negative, with the exception of above mentioned in HPI   Objective:  BP 119/71   Temp 100 F (37.8 C)   Wt 143 lb (64.9 kg)   BMI 23.43 kg/m  Body mass index is 23.43 kg/m. Physical Exam Vitals and nursing note reviewed.  Constitutional:      General: She is not in acute distress.    Appearance: Normal appearance. She is normal Frost. She is not ill-appearing or toxic-appearing.  HENT:     Head: Normocephalic and atraumatic.     Nose: Congestion present.  Eyes:     Extraocular Movements: Extraocular movements intact.     Conjunctiva/sclera: Conjunctivae normal.     Pupils: Pupils are equal, round, and reactive to light.  Pulmonary:     Effort: Pulmonary effort is normal.  Musculoskeletal:     Cervical back: Normal range of motion.  Skin:    Findings: No rash.  Neurological:      Mental Status: She is alert and oriented to person, place, and time. Mental status is at baseline.  Psychiatric:        Mood and Affect: Mood normal.        Behavior: Behavior normal.        Thought Content: Thought content normal.        Judgment: Judgment normal.      No results found. No results found. No results found for this or any previous visit (from the past 24 hour(s)).  Assessment/Plan: Sarah Laski  Frost is a 66 y.o. female present for OV for  COVID-19/Acute cough Rest, hydrate.  mucinex (DM if cough) OTC Molnupiravir prescribed, take until completed.  Reviewed home care instructions for COVID. Advised self-isolation at home for at least 5 days. After 5 days, if improved and fever resolved, can be in public, but should wear a mask around others for an additional 5 days. If symptoms, esp, dyspnea develops/worsens, recommend in-person evaluation at either an urgent care or the emergency room.   Reviewed expectations re: course of current medical issues. Discussed self-management of symptoms. Outlined signs and symptoms indicating need for more acute intervention. Patient verbalized understanding and all questions were answered. Patient received an After-Visit Summary.    No orders of the defined types were placed in this encounter.  Meds ordered this encounter  Medications   molnupiravir EUA (LAGEVRIO) 200 mg CAPS capsule    Sig: Take 4 capsules (800 mg total) by mouth 2 (two) times daily for 5 days.    Dispense:  40 capsule    Refill:  0   Referral Orders  No referral(s) requested today     Note is dictated utilizing voice recognition software. Although note has been proof read prior to signing, occasional typographical errors still can be missed. If any questions arise, please do not hesitate to call for verification.   electronically signed by:  Howard Pouch, DO  [facility]

## 2022-10-22 DIAGNOSIS — M9906 Segmental and somatic dysfunction of lower extremity: Secondary | ICD-10-CM | POA: Diagnosis not present

## 2022-10-22 DIAGNOSIS — M9905 Segmental and somatic dysfunction of pelvic region: Secondary | ICD-10-CM | POA: Diagnosis not present

## 2022-10-22 DIAGNOSIS — M25552 Pain in left hip: Secondary | ICD-10-CM | POA: Diagnosis not present

## 2022-10-22 DIAGNOSIS — M9904 Segmental and somatic dysfunction of sacral region: Secondary | ICD-10-CM | POA: Diagnosis not present

## 2022-10-22 DIAGNOSIS — M79652 Pain in left thigh: Secondary | ICD-10-CM | POA: Diagnosis not present

## 2022-10-28 DIAGNOSIS — H538 Other visual disturbances: Secondary | ICD-10-CM | POA: Diagnosis not present

## 2022-10-29 ENCOUNTER — Encounter (HOSPITAL_BASED_OUTPATIENT_CLINIC_OR_DEPARTMENT_OTHER): Payer: Self-pay

## 2022-10-29 ENCOUNTER — Ambulatory Visit (HOSPITAL_BASED_OUTPATIENT_CLINIC_OR_DEPARTMENT_OTHER)
Admission: RE | Admit: 2022-10-29 | Discharge: 2022-10-29 | Disposition: A | Discharge status: Home / Self Care | Payer: Medicare Other | Source: Ambulatory Visit | Attending: Family Medicine | Admitting: Family Medicine

## 2022-10-29 DIAGNOSIS — I10 Essential (primary) hypertension: Secondary | ICD-10-CM | POA: Insufficient documentation

## 2022-10-29 DIAGNOSIS — E782 Mixed hyperlipidemia: Secondary | ICD-10-CM | POA: Insufficient documentation

## 2022-10-29 DIAGNOSIS — Z8249 Family history of ischemic heart disease and other diseases of the circulatory system: Secondary | ICD-10-CM | POA: Insufficient documentation

## 2022-11-03 ENCOUNTER — Ambulatory Visit
Admission: RE | Admit: 2022-11-03 | Discharge: 2022-11-03 | Disposition: A | Discharge status: Home / Self Care | Payer: Medicare Other | Source: Ambulatory Visit

## 2022-11-03 DIAGNOSIS — Z1231 Encounter for screening mammogram for malignant neoplasm of breast: Secondary | ICD-10-CM

## 2022-11-11 DIAGNOSIS — I83891 Varicose veins of right lower extremities with other complications: Secondary | ICD-10-CM | POA: Diagnosis not present

## 2022-11-18 DIAGNOSIS — M79662 Pain in left lower leg: Secondary | ICD-10-CM | POA: Diagnosis not present

## 2022-11-18 DIAGNOSIS — I83892 Varicose veins of left lower extremities with other complications: Secondary | ICD-10-CM | POA: Diagnosis not present

## 2022-11-25 DIAGNOSIS — I83891 Varicose veins of right lower extremities with other complications: Secondary | ICD-10-CM | POA: Diagnosis not present

## 2022-11-26 DIAGNOSIS — L814 Other melanin hyperpigmentation: Secondary | ICD-10-CM | POA: Diagnosis not present

## 2022-11-26 DIAGNOSIS — Z85828 Personal history of other malignant neoplasm of skin: Secondary | ICD-10-CM | POA: Diagnosis not present

## 2022-11-26 DIAGNOSIS — Z09 Encounter for follow-up examination after completed treatment for conditions other than malignant neoplasm: Secondary | ICD-10-CM | POA: Diagnosis not present

## 2022-11-26 DIAGNOSIS — L821 Other seborrheic keratosis: Secondary | ICD-10-CM | POA: Diagnosis not present

## 2022-11-26 DIAGNOSIS — L988 Other specified disorders of the skin and subcutaneous tissue: Secondary | ICD-10-CM | POA: Diagnosis not present

## 2022-11-26 DIAGNOSIS — L718 Other rosacea: Secondary | ICD-10-CM | POA: Diagnosis not present

## 2022-11-26 DIAGNOSIS — D225 Melanocytic nevi of trunk: Secondary | ICD-10-CM | POA: Diagnosis not present

## 2022-11-26 DIAGNOSIS — L080 Pyoderma: Secondary | ICD-10-CM | POA: Diagnosis not present

## 2022-11-26 DIAGNOSIS — Z872 Personal history of diseases of the skin and subcutaneous tissue: Secondary | ICD-10-CM | POA: Diagnosis not present

## 2022-11-26 DIAGNOSIS — Z08 Encounter for follow-up examination after completed treatment for malignant neoplasm: Secondary | ICD-10-CM | POA: Diagnosis not present

## 2022-11-26 DIAGNOSIS — I788 Other diseases of capillaries: Secondary | ICD-10-CM | POA: Diagnosis not present

## 2022-12-01 DIAGNOSIS — M79642 Pain in left hand: Secondary | ICD-10-CM | POA: Diagnosis not present

## 2022-12-01 DIAGNOSIS — M19042 Primary osteoarthritis, left hand: Secondary | ICD-10-CM | POA: Diagnosis not present

## 2022-12-01 DIAGNOSIS — I83812 Varicose veins of left lower extremities with pain: Secondary | ICD-10-CM | POA: Diagnosis not present

## 2022-12-01 DIAGNOSIS — M19041 Primary osteoarthritis, right hand: Secondary | ICD-10-CM | POA: Diagnosis not present

## 2022-12-01 DIAGNOSIS — I83892 Varicose veins of left lower extremities with other complications: Secondary | ICD-10-CM | POA: Diagnosis not present

## 2022-12-01 DIAGNOSIS — M79641 Pain in right hand: Secondary | ICD-10-CM | POA: Diagnosis not present

## 2022-12-01 DIAGNOSIS — M7989 Other specified soft tissue disorders: Secondary | ICD-10-CM | POA: Diagnosis not present

## 2022-12-04 DIAGNOSIS — H2513 Age-related nuclear cataract, bilateral: Secondary | ICD-10-CM | POA: Diagnosis not present

## 2022-12-08 DIAGNOSIS — I83891 Varicose veins of right lower extremities with other complications: Secondary | ICD-10-CM | POA: Diagnosis not present

## 2022-12-09 DIAGNOSIS — M9904 Segmental and somatic dysfunction of sacral region: Secondary | ICD-10-CM | POA: Diagnosis not present

## 2022-12-09 DIAGNOSIS — M9906 Segmental and somatic dysfunction of lower extremity: Secondary | ICD-10-CM | POA: Diagnosis not present

## 2022-12-09 DIAGNOSIS — M9905 Segmental and somatic dysfunction of pelvic region: Secondary | ICD-10-CM | POA: Diagnosis not present

## 2022-12-09 DIAGNOSIS — M25552 Pain in left hip: Secondary | ICD-10-CM | POA: Diagnosis not present

## 2022-12-18 DIAGNOSIS — I83892 Varicose veins of left lower extremities with other complications: Secondary | ICD-10-CM | POA: Diagnosis not present

## 2022-12-24 DIAGNOSIS — M7989 Other specified soft tissue disorders: Secondary | ICD-10-CM | POA: Diagnosis not present

## 2022-12-24 DIAGNOSIS — I83811 Varicose veins of right lower extremities with pain: Secondary | ICD-10-CM | POA: Diagnosis not present

## 2022-12-24 DIAGNOSIS — I83891 Varicose veins of right lower extremities with other complications: Secondary | ICD-10-CM | POA: Diagnosis not present

## 2023-01-05 DIAGNOSIS — M545 Low back pain, unspecified: Secondary | ICD-10-CM | POA: Diagnosis not present

## 2023-01-05 DIAGNOSIS — M25552 Pain in left hip: Secondary | ICD-10-CM | POA: Diagnosis not present

## 2023-01-05 DIAGNOSIS — M9906 Segmental and somatic dysfunction of lower extremity: Secondary | ICD-10-CM | POA: Diagnosis not present

## 2023-01-05 DIAGNOSIS — M9905 Segmental and somatic dysfunction of pelvic region: Secondary | ICD-10-CM | POA: Diagnosis not present

## 2023-01-05 DIAGNOSIS — M9904 Segmental and somatic dysfunction of sacral region: Secondary | ICD-10-CM | POA: Diagnosis not present

## 2023-01-05 DIAGNOSIS — M9903 Segmental and somatic dysfunction of lumbar region: Secondary | ICD-10-CM | POA: Diagnosis not present

## 2023-01-14 DIAGNOSIS — L309 Dermatitis, unspecified: Secondary | ICD-10-CM | POA: Diagnosis not present

## 2023-02-18 DIAGNOSIS — M25552 Pain in left hip: Secondary | ICD-10-CM | POA: Diagnosis not present

## 2023-02-18 DIAGNOSIS — M79652 Pain in left thigh: Secondary | ICD-10-CM | POA: Diagnosis not present

## 2023-02-18 DIAGNOSIS — M25562 Pain in left knee: Secondary | ICD-10-CM | POA: Diagnosis not present

## 2023-02-18 DIAGNOSIS — R531 Weakness: Secondary | ICD-10-CM | POA: Diagnosis not present

## 2023-03-06 DIAGNOSIS — I83813 Varicose veins of bilateral lower extremities with pain: Secondary | ICD-10-CM | POA: Diagnosis not present

## 2023-03-06 DIAGNOSIS — R252 Cramp and spasm: Secondary | ICD-10-CM | POA: Diagnosis not present

## 2023-03-06 DIAGNOSIS — M7989 Other specified soft tissue disorders: Secondary | ICD-10-CM | POA: Diagnosis not present

## 2023-03-06 DIAGNOSIS — I10 Essential (primary) hypertension: Secondary | ICD-10-CM | POA: Diagnosis not present

## 2023-03-06 DIAGNOSIS — I872 Venous insufficiency (chronic) (peripheral): Secondary | ICD-10-CM | POA: Diagnosis not present

## 2023-03-11 ENCOUNTER — Other Ambulatory Visit: Payer: Self-pay | Admitting: Sports Medicine

## 2023-03-11 DIAGNOSIS — M25552 Pain in left hip: Secondary | ICD-10-CM | POA: Diagnosis not present

## 2023-03-11 DIAGNOSIS — M79652 Pain in left thigh: Secondary | ICD-10-CM

## 2023-03-11 DIAGNOSIS — M25562 Pain in left knee: Secondary | ICD-10-CM

## 2023-03-17 DIAGNOSIS — M7602 Gluteal tendinitis, left hip: Secondary | ICD-10-CM | POA: Diagnosis not present

## 2023-03-19 DIAGNOSIS — M7989 Other specified soft tissue disorders: Secondary | ICD-10-CM | POA: Diagnosis not present

## 2023-03-19 DIAGNOSIS — M25552 Pain in left hip: Secondary | ICD-10-CM | POA: Diagnosis not present

## 2023-03-26 DIAGNOSIS — R29898 Other symptoms and signs involving the musculoskeletal system: Secondary | ICD-10-CM | POA: Diagnosis not present

## 2023-03-26 DIAGNOSIS — M79652 Pain in left thigh: Secondary | ICD-10-CM | POA: Diagnosis not present

## 2023-03-26 DIAGNOSIS — M25652 Stiffness of left hip, not elsewhere classified: Secondary | ICD-10-CM | POA: Diagnosis not present

## 2023-03-26 DIAGNOSIS — R262 Difficulty in walking, not elsewhere classified: Secondary | ICD-10-CM | POA: Diagnosis not present

## 2023-03-26 DIAGNOSIS — M4807 Spinal stenosis, lumbosacral region: Secondary | ICD-10-CM | POA: Diagnosis not present

## 2023-03-30 DIAGNOSIS — M25652 Stiffness of left hip, not elsewhere classified: Secondary | ICD-10-CM | POA: Diagnosis not present

## 2023-03-30 DIAGNOSIS — R262 Difficulty in walking, not elsewhere classified: Secondary | ICD-10-CM | POA: Diagnosis not present

## 2023-03-30 DIAGNOSIS — M79652 Pain in left thigh: Secondary | ICD-10-CM | POA: Diagnosis not present

## 2023-03-30 DIAGNOSIS — R29898 Other symptoms and signs involving the musculoskeletal system: Secondary | ICD-10-CM | POA: Diagnosis not present

## 2023-03-30 DIAGNOSIS — M4807 Spinal stenosis, lumbosacral region: Secondary | ICD-10-CM | POA: Diagnosis not present

## 2023-03-31 DIAGNOSIS — J029 Acute pharyngitis, unspecified: Secondary | ICD-10-CM | POA: Diagnosis not present

## 2023-03-31 DIAGNOSIS — I10 Essential (primary) hypertension: Secondary | ICD-10-CM | POA: Diagnosis not present

## 2023-03-31 DIAGNOSIS — Z6824 Body mass index (BMI) 24.0-24.9, adult: Secondary | ICD-10-CM | POA: Diagnosis not present

## 2023-04-02 DIAGNOSIS — M4807 Spinal stenosis, lumbosacral region: Secondary | ICD-10-CM | POA: Diagnosis not present

## 2023-04-02 DIAGNOSIS — R29898 Other symptoms and signs involving the musculoskeletal system: Secondary | ICD-10-CM | POA: Diagnosis not present

## 2023-04-02 DIAGNOSIS — R262 Difficulty in walking, not elsewhere classified: Secondary | ICD-10-CM | POA: Diagnosis not present

## 2023-04-02 DIAGNOSIS — M25652 Stiffness of left hip, not elsewhere classified: Secondary | ICD-10-CM | POA: Diagnosis not present

## 2023-04-02 DIAGNOSIS — M79652 Pain in left thigh: Secondary | ICD-10-CM | POA: Diagnosis not present

## 2023-04-06 DIAGNOSIS — M4807 Spinal stenosis, lumbosacral region: Secondary | ICD-10-CM | POA: Diagnosis not present

## 2023-04-06 DIAGNOSIS — R29898 Other symptoms and signs involving the musculoskeletal system: Secondary | ICD-10-CM | POA: Diagnosis not present

## 2023-04-06 DIAGNOSIS — M79652 Pain in left thigh: Secondary | ICD-10-CM | POA: Diagnosis not present

## 2023-04-06 DIAGNOSIS — M25652 Stiffness of left hip, not elsewhere classified: Secondary | ICD-10-CM | POA: Diagnosis not present

## 2023-04-06 DIAGNOSIS — R262 Difficulty in walking, not elsewhere classified: Secondary | ICD-10-CM | POA: Diagnosis not present

## 2023-04-09 DIAGNOSIS — M4807 Spinal stenosis, lumbosacral region: Secondary | ICD-10-CM | POA: Diagnosis not present

## 2023-04-09 DIAGNOSIS — R262 Difficulty in walking, not elsewhere classified: Secondary | ICD-10-CM | POA: Diagnosis not present

## 2023-04-09 DIAGNOSIS — R29898 Other symptoms and signs involving the musculoskeletal system: Secondary | ICD-10-CM | POA: Diagnosis not present

## 2023-04-09 DIAGNOSIS — M25652 Stiffness of left hip, not elsewhere classified: Secondary | ICD-10-CM | POA: Diagnosis not present

## 2023-04-09 DIAGNOSIS — M79652 Pain in left thigh: Secondary | ICD-10-CM | POA: Diagnosis not present

## 2023-04-13 ENCOUNTER — Other Ambulatory Visit: Payer: Self-pay

## 2023-04-13 MED ORDER — TRIAMTERENE-HCTZ 37.5-25 MG PO TABS: 1.0000 | ORAL_TABLET | Freq: Every day | ORAL | 0 refills | Status: DC

## 2023-04-15 DIAGNOSIS — M4807 Spinal stenosis, lumbosacral region: Secondary | ICD-10-CM | POA: Diagnosis not present

## 2023-04-15 DIAGNOSIS — R29898 Other symptoms and signs involving the musculoskeletal system: Secondary | ICD-10-CM | POA: Diagnosis not present

## 2023-04-15 DIAGNOSIS — R262 Difficulty in walking, not elsewhere classified: Secondary | ICD-10-CM | POA: Diagnosis not present

## 2023-04-15 DIAGNOSIS — M79652 Pain in left thigh: Secondary | ICD-10-CM | POA: Diagnosis not present

## 2023-04-15 DIAGNOSIS — M25652 Stiffness of left hip, not elsewhere classified: Secondary | ICD-10-CM | POA: Diagnosis not present

## 2023-04-17 DIAGNOSIS — D2239 Melanocytic nevi of other parts of face: Secondary | ICD-10-CM | POA: Diagnosis not present

## 2023-04-17 DIAGNOSIS — L570 Actinic keratosis: Secondary | ICD-10-CM | POA: Diagnosis not present

## 2023-04-17 DIAGNOSIS — D1801 Hemangioma of skin and subcutaneous tissue: Secondary | ICD-10-CM | POA: Diagnosis not present

## 2023-04-17 DIAGNOSIS — D224 Melanocytic nevi of scalp and neck: Secondary | ICD-10-CM | POA: Diagnosis not present

## 2023-04-17 DIAGNOSIS — D225 Melanocytic nevi of trunk: Secondary | ICD-10-CM | POA: Diagnosis not present

## 2023-04-17 DIAGNOSIS — L218 Other seborrheic dermatitis: Secondary | ICD-10-CM | POA: Diagnosis not present

## 2023-04-17 DIAGNOSIS — I788 Other diseases of capillaries: Secondary | ICD-10-CM | POA: Diagnosis not present

## 2023-04-17 DIAGNOSIS — Z85828 Personal history of other malignant neoplasm of skin: Secondary | ICD-10-CM | POA: Diagnosis not present

## 2023-04-17 DIAGNOSIS — L821 Other seborrheic keratosis: Secondary | ICD-10-CM | POA: Diagnosis not present

## 2023-04-17 DIAGNOSIS — L814 Other melanin hyperpigmentation: Secondary | ICD-10-CM | POA: Diagnosis not present

## 2023-04-17 DIAGNOSIS — D2271 Melanocytic nevi of right lower limb, including hip: Secondary | ICD-10-CM | POA: Diagnosis not present

## 2023-04-20 DIAGNOSIS — R262 Difficulty in walking, not elsewhere classified: Secondary | ICD-10-CM | POA: Diagnosis not present

## 2023-04-20 DIAGNOSIS — M4807 Spinal stenosis, lumbosacral region: Secondary | ICD-10-CM | POA: Diagnosis not present

## 2023-04-20 DIAGNOSIS — M79652 Pain in left thigh: Secondary | ICD-10-CM | POA: Diagnosis not present

## 2023-04-20 DIAGNOSIS — M25652 Stiffness of left hip, not elsewhere classified: Secondary | ICD-10-CM | POA: Diagnosis not present

## 2023-04-20 DIAGNOSIS — R29898 Other symptoms and signs involving the musculoskeletal system: Secondary | ICD-10-CM | POA: Diagnosis not present

## 2023-04-27 DIAGNOSIS — M79652 Pain in left thigh: Secondary | ICD-10-CM | POA: Diagnosis not present

## 2023-04-27 DIAGNOSIS — M4807 Spinal stenosis, lumbosacral region: Secondary | ICD-10-CM | POA: Diagnosis not present

## 2023-04-27 DIAGNOSIS — R29898 Other symptoms and signs involving the musculoskeletal system: Secondary | ICD-10-CM | POA: Diagnosis not present

## 2023-04-27 DIAGNOSIS — M25652 Stiffness of left hip, not elsewhere classified: Secondary | ICD-10-CM | POA: Diagnosis not present

## 2023-04-27 DIAGNOSIS — R262 Difficulty in walking, not elsewhere classified: Secondary | ICD-10-CM | POA: Diagnosis not present

## 2023-04-28 DIAGNOSIS — M9903 Segmental and somatic dysfunction of lumbar region: Secondary | ICD-10-CM | POA: Diagnosis not present

## 2023-04-28 DIAGNOSIS — M9906 Segmental and somatic dysfunction of lower extremity: Secondary | ICD-10-CM | POA: Diagnosis not present

## 2023-04-28 DIAGNOSIS — M9905 Segmental and somatic dysfunction of pelvic region: Secondary | ICD-10-CM | POA: Diagnosis not present

## 2023-04-28 DIAGNOSIS — M9904 Segmental and somatic dysfunction of sacral region: Secondary | ICD-10-CM | POA: Diagnosis not present

## 2023-04-28 DIAGNOSIS — M79652 Pain in left thigh: Secondary | ICD-10-CM | POA: Diagnosis not present

## 2023-04-28 DIAGNOSIS — M25552 Pain in left hip: Secondary | ICD-10-CM | POA: Diagnosis not present

## 2023-05-04 DIAGNOSIS — M4807 Spinal stenosis, lumbosacral region: Secondary | ICD-10-CM | POA: Diagnosis not present

## 2023-05-04 DIAGNOSIS — M79652 Pain in left thigh: Secondary | ICD-10-CM | POA: Diagnosis not present

## 2023-05-04 DIAGNOSIS — M25652 Stiffness of left hip, not elsewhere classified: Secondary | ICD-10-CM | POA: Diagnosis not present

## 2023-05-04 DIAGNOSIS — R29898 Other symptoms and signs involving the musculoskeletal system: Secondary | ICD-10-CM | POA: Diagnosis not present

## 2023-05-04 DIAGNOSIS — R262 Difficulty in walking, not elsewhere classified: Secondary | ICD-10-CM | POA: Diagnosis not present

## 2023-05-11 ENCOUNTER — Other Ambulatory Visit: Payer: Self-pay | Admitting: Family Medicine

## 2023-05-20 ENCOUNTER — Other Ambulatory Visit: Payer: Self-pay | Admitting: Family Medicine

## 2023-05-28 ENCOUNTER — Other Ambulatory Visit: Payer: Self-pay

## 2023-05-28 ENCOUNTER — Encounter: Payer: Self-pay | Admitting: Family Medicine

## 2023-05-28 DIAGNOSIS — R29898 Other symptoms and signs involving the musculoskeletal system: Secondary | ICD-10-CM | POA: Diagnosis not present

## 2023-05-28 DIAGNOSIS — M79652 Pain in left thigh: Secondary | ICD-10-CM | POA: Diagnosis not present

## 2023-05-28 DIAGNOSIS — M25652 Stiffness of left hip, not elsewhere classified: Secondary | ICD-10-CM | POA: Diagnosis not present

## 2023-05-28 DIAGNOSIS — M4807 Spinal stenosis, lumbosacral region: Secondary | ICD-10-CM | POA: Diagnosis not present

## 2023-05-28 DIAGNOSIS — R262 Difficulty in walking, not elsewhere classified: Secondary | ICD-10-CM | POA: Diagnosis not present

## 2023-05-28 MED ORDER — TRIAMTERENE-HCTZ 37.5-25 MG PO TABS: 1.0000 | ORAL_TABLET | Freq: Every day | ORAL | 0 refills | Status: DC

## 2023-06-01 DIAGNOSIS — R262 Difficulty in walking, not elsewhere classified: Secondary | ICD-10-CM | POA: Diagnosis not present

## 2023-06-01 DIAGNOSIS — M4807 Spinal stenosis, lumbosacral region: Secondary | ICD-10-CM | POA: Diagnosis not present

## 2023-06-01 DIAGNOSIS — M79652 Pain in left thigh: Secondary | ICD-10-CM | POA: Diagnosis not present

## 2023-06-01 DIAGNOSIS — R29898 Other symptoms and signs involving the musculoskeletal system: Secondary | ICD-10-CM | POA: Diagnosis not present

## 2023-06-01 DIAGNOSIS — M25652 Stiffness of left hip, not elsewhere classified: Secondary | ICD-10-CM | POA: Diagnosis not present

## 2023-06-08 DIAGNOSIS — M9906 Segmental and somatic dysfunction of lower extremity: Secondary | ICD-10-CM | POA: Diagnosis not present

## 2023-06-08 DIAGNOSIS — M25562 Pain in left knee: Secondary | ICD-10-CM | POA: Diagnosis not present

## 2023-06-12 DIAGNOSIS — M25652 Stiffness of left hip, not elsewhere classified: Secondary | ICD-10-CM | POA: Diagnosis not present

## 2023-06-12 DIAGNOSIS — M79652 Pain in left thigh: Secondary | ICD-10-CM | POA: Diagnosis not present

## 2023-06-12 DIAGNOSIS — R262 Difficulty in walking, not elsewhere classified: Secondary | ICD-10-CM | POA: Diagnosis not present

## 2023-06-12 DIAGNOSIS — M4807 Spinal stenosis, lumbosacral region: Secondary | ICD-10-CM | POA: Diagnosis not present

## 2023-06-12 DIAGNOSIS — R29898 Other symptoms and signs involving the musculoskeletal system: Secondary | ICD-10-CM | POA: Diagnosis not present

## 2023-06-16 ENCOUNTER — Encounter: Payer: Self-pay | Admitting: Family Medicine

## 2023-06-16 ENCOUNTER — Ambulatory Visit (INDEPENDENT_AMBULATORY_CARE_PROVIDER_SITE_OTHER): Payer: Medicare Other | Admitting: Family Medicine

## 2023-06-16 VITALS — BP 135/81 | HR 70 | Temp 97.9°F | Ht 64.57 in | Wt 151.2 lb

## 2023-06-16 DIAGNOSIS — Z131 Encounter for screening for diabetes mellitus: Secondary | ICD-10-CM

## 2023-06-16 DIAGNOSIS — M8580 Other specified disorders of bone density and structure, unspecified site: Secondary | ICD-10-CM

## 2023-06-16 DIAGNOSIS — M25652 Stiffness of left hip, not elsewhere classified: Secondary | ICD-10-CM | POA: Diagnosis not present

## 2023-06-16 DIAGNOSIS — E782 Mixed hyperlipidemia: Secondary | ICD-10-CM

## 2023-06-16 DIAGNOSIS — R29898 Other symptoms and signs involving the musculoskeletal system: Secondary | ICD-10-CM | POA: Diagnosis not present

## 2023-06-16 DIAGNOSIS — R262 Difficulty in walking, not elsewhere classified: Secondary | ICD-10-CM | POA: Diagnosis not present

## 2023-06-16 DIAGNOSIS — M79652 Pain in left thigh: Secondary | ICD-10-CM | POA: Diagnosis not present

## 2023-06-16 DIAGNOSIS — E2839 Other primary ovarian failure: Secondary | ICD-10-CM

## 2023-06-16 DIAGNOSIS — M4807 Spinal stenosis, lumbosacral region: Secondary | ICD-10-CM | POA: Diagnosis not present

## 2023-06-16 DIAGNOSIS — Z8249 Family history of ischemic heart disease and other diseases of the circulatory system: Secondary | ICD-10-CM

## 2023-06-16 DIAGNOSIS — I10 Essential (primary) hypertension: Secondary | ICD-10-CM | POA: Diagnosis not present

## 2023-06-16 DIAGNOSIS — Z86010 Personal history of colonic polyps: Secondary | ICD-10-CM

## 2023-06-16 LAB — COMPREHENSIVE METABOLIC PANEL
ALT: 18 U/L (ref 0–35)
AST: 23 U/L (ref 0–37)
Albumin: 4.4 g/dL (ref 3.5–5.2)
Alkaline Phosphatase: 57 U/L (ref 39–117)
BUN: 11 mg/dL (ref 6–23)
CO2: 28 mEq/L (ref 19–32)
Calcium: 9.4 mg/dL (ref 8.4–10.5)
Chloride: 92 mEq/L — ABNORMAL LOW (ref 96–112)
Creatinine, Ser: 0.54 mg/dL (ref 0.40–1.20)
GFR: 95.27 mL/min (ref >60.00)
Glucose, Bld: 93 mg/dL (ref 70–99)
Potassium: 4.1 mEq/L (ref 3.5–5.1)
Sodium: 130 mEq/L — ABNORMAL LOW (ref 135–145)
Total Bilirubin: 0.7 mg/dL (ref 0.2–1.2)
Total Protein: 7.3 g/dL (ref 6.0–8.3)

## 2023-06-16 LAB — CBC WITH DIFFERENTIAL/PLATELET
Basophils Absolute: 0.1 10*3/uL (ref 0.0–0.1)
Basophils Relative: 1 % (ref 0.0–3.0)
Eosinophils Absolute: 0.3 10*3/uL (ref 0.0–0.7)
Eosinophils Relative: 4.3 % (ref 0.0–5.0)
HCT: 38.2 % (ref 36.0–46.0)
Hemoglobin: 12.6 g/dL (ref 12.0–15.0)
Lymphocytes Relative: 26.6 % (ref 12.0–46.0)
Lymphs Abs: 1.5 10*3/uL (ref 0.7–4.0)
MCHC: 32.9 g/dL (ref 30.0–36.0)
MCV: 94.7 fl (ref 78.0–100.0)
Monocytes Absolute: 0.4 10*3/uL (ref 0.1–1.0)
Monocytes Relative: 7.2 % (ref 3.0–12.0)
Neutro Abs: 3.5 10*3/uL (ref 1.4–7.7)
Neutrophils Relative %: 60.9 % (ref 43.0–77.0)
Platelets: 262 10*3/uL (ref 150.0–400.0)
RBC: 4.04 Mil/uL (ref 3.87–5.11)
RDW: 13.6 % (ref 11.5–15.5)
WBC: 5.8 10*3/uL (ref 4.0–10.5)

## 2023-06-16 LAB — LIPID PANEL
Cholesterol: 239 mg/dL — ABNORMAL HIGH (ref 0–200)
HDL: 83.5 mg/dL (ref >39.00)
LDL Cholesterol: 145 mg/dL — ABNORMAL HIGH (ref 0–99)
NonHDL: 155.71
Total CHOL/HDL Ratio: 3
Triglycerides: 54 mg/dL (ref 0.0–149.0)
VLDL: 10.8 mg/dL (ref 0.0–40.0)

## 2023-06-16 LAB — TSH: TSH: 1.36 u[IU]/mL (ref 0.35–5.50)

## 2023-06-16 LAB — HEMOGLOBIN A1C: Hgb A1c MFr Bld: 5.4 % (ref 4.6–6.5)

## 2023-06-16 MED ORDER — TRIAMTERENE-HCTZ 37.5-25 MG PO TABS: 1.0000 | ORAL_TABLET | Freq: Every day | ORAL | 1 refills | Status: DC

## 2023-06-16 MED ORDER — ESTRADIOL 0.1 MG/GM VA CREA: TOPICAL_CREAM | VAGINAL | 11 refills | Status: DC

## 2023-06-16 NOTE — Progress Notes (Addendum)
Patient ID: Sarah Frost, female  DOB: December 13, 1956, 67 y.o.   MRN: [id number] Patient Care Team    Relationship Specialty Notifications Start End  Natalia Leatherwood, DO PCP - General Family Medicine  04/09/22   [facility] Dermatology Consulting Physician Dermatology  04/09/22   Sarah Pila, MD Consulting Physician Obstetrics and Gynecology  04/09/22   Rodrigo Ran, OD  Ophthalmology  04/09/22   Kerin Salen, MD Consulting Physician Gastroenterology  04/09/22   [facility] Consulting Physician Peripheral Vascular Disease  04/09/22   Andrena Mews, DO Referring Physician Sports Medicine  04/09/22     Chief Complaint  Patient presents with   Hypertension    Pt is fasting    Subjective:  Sarah Frost is a 67 y.o. female present for Chronic Conditions/illness Management . All past medical history, surgical history, allergies, family history, immunizations, medications and social history were updated in the electronic medical record today. All recent labs, ED visits and hospitalizations within the last year were reviewed.  Health maintenance:  Colonoscopy: last screen 06/10/2018, recommend follow up 5. Completed by Dr.Karki Deboraha [facility]). Immunizations:  tdap Td 2016, influenza UTD 2023, PNA series completed, Shingrix series completed  HTN/HLD: Pt reports compliance with triamterene-hydrochlorothiazide 37.5-25.  Patient denies chest pain, shortness of breath, dizziness or lower extremity edema.  Pt does not take a daily baby ASA. Pt is not prescribed statin. Diet: Low-sodium RF:fh heart disease, HTN, HLD  Osteopenia: T score -1.3 completed January 2023.  She does supplement with calcium and vitamin D.  She exercises routinely.  Estrogen deficiency: Patient reports she would like to continue her Estrace 0.1 mg/GM vaginal cream twice weekly.  No negative side effects up-to-date with mammograms     08/06/2022   11:35 AM 04/09/2022    8:46 AM  Depression screen PHQ  2/9  Decreased Interest 0 0  Down, Depressed, Hopeless 0 0  PHQ - 2 Score 0 0       No data to display                09/07/2022   11:41 AM 08/06/2022   11:39 AM 04/09/2022    8:46 AM  Fall Risk   Falls in the past year? 0 0 0  Number falls in past yr:  0 0  Injury with Fall?  0 0  Risk for fall due to :  Impaired vision;Impaired balance/gait No Fall Risks  Risk for fall due to: Comment  working with PT and sport dr   Follow up  Falls prevention discussed Falls evaluation completed    Immunization History  Administered Date(s) Administered   Fluad Quad(high Dose 65+) 09/17/2022   Influenza Split 11/01/2018, 09/16/2021   Influenza,inj,Quad PF,6+ Mos 10/20/2016, 11/01/2018, 10/04/2020   Influenza,inj,quad, With Preservative 09/18/2015   PFIZER(Purple Top)SARS-COV-2 Vaccination 03/04/2020, 03/27/2020, 10/04/2020, 04/17/2021, 09/16/2021   PNEUMOCOCCAL CONJUGATE-20 08/01/2021   Tdap 09/18/2015   Zoster Recombinat (Shingrix) 08/28/2021, 10/29/2021   Zoster, Live 08/28/2021, 10/29/2021   Past Medical History:  Diagnosis Date   Allergy    Arthritis 2014   Chicken pox    Colon polyps    History of cystocele 10/30/2020   Hyperlipidemia, unspecified    Hypertension 2012   Migraine 10/30/2020   Ovarian cyst    Squamous cell skin cancer, face    Allergies  Allergen Reactions   Penicillins Hives   Past Surgical History:  Procedure Laterality Date   OOPHORECTOMY Right 2004   large  cyst of ovary-benign   Family History  Problem Relation Age of Onset   Hyperlipidemia Mother    Arthritis Mother    Heart disease Mother    Hypertension Mother    Vision loss Mother    Varicose Veins Mother    Hyperlipidemia Father    Arthritis Father    Heart disease Father    Hypertension Father    Heart attack Father    Arthritis Brother    Hyperlipidemia Brother    Hypertension Brother    Healthy Daughter        Sarah Frost, Sarah Frost and Sarah Frost   Drug abuse Son    Colon cancer  Maternal Aunt    Hyperlipidemia Maternal Grandmother    Hypertension Maternal Grandmother    Heart attack Maternal Grandmother    Heart disease Maternal Grandmother    Colon cancer Maternal Grandfather    Hyperlipidemia Paternal Grandmother    Hypertension Paternal Grandmother    Heart attack Paternal Grandmother    Heart disease Paternal Grandmother    Hypertension Paternal Grandfather    Hyperlipidemia Paternal Grandfather    Heart attack Paternal Grandfather    Heart disease Paternal Grandfather    Social History   Social History Narrative   Marital status/children/pets: Married   Education/employment: B.A., employed in  Engineer, manufacturing:      -Wears a bicycle helmet riding a bike: Yes     -smoke alarm in the home:Yes     - wears seatbelt: Yes     - Feels safe in their relationships: Yes       Allergies as of 06/16/2023       Reactions   Penicillins Hives        Medication List        Accurate as of June 16, 2023  9:24 AM. If you have any questions, ask your nurse or doctor.          STOP taking these medications    clindamycin 1 % lotion Commonly known as: CLEOCIN T Stopped by: Felix Pacini, DO   ELDERBERRY PO Stopped by: Felix Pacini, DO   ketoconazole 2 % cream Commonly known as: NIZORAL Stopped by: Felix Pacini, DO       TAKE these medications    calcium carbonate 1500 (600 Ca) MG Tabs tablet Commonly known as: OSCAL 1 tablet with meals   estradiol 0.1 MG/GM vaginal cream Commonly known as: ESTRACE 2x weekly   loratadine 10 MG tablet Commonly known as: CLARITIN 1 tablet   magnesium citrate Soln Take 296 mLs by mouth once.   triamterene-hydrochlorothiazide 37.5-25 MG tablet Commonly known as: MAXZIDE-25 Take 1 tablet by mouth daily. What changed: additional instructions Changed by: Felix Pacini, DO       All past medical history, surgical history, allergies, family history, immunizations andmedications were updated  in the EMR today and reviewed under the history and medication portions of their EMR.     No results found for this or any previous visit (from the past 2160 hour(s)).  MM 3D SCREEN BREAST BILATERAL  Result Date: 11/05/2022 CLINICAL DATA:  Screening. EXAM: DIGITAL SCREENING BILATERAL MAMMOGRAM WITH TOMOSYNTHESIS AND CAD TECHNIQUE: Bilateral screening digital craniocaudal and mediolateral oblique mammograms were obtained. Bilateral screening digital breast tomosynthesis was performed. The images were evaluated with computer-aided detection. COMPARISON:  Previous exam(s). ACR Breast Density Category b: There are scattered areas of fibroglandular density. FINDINGS: There are no findings suspicious for malignancy. IMPRESSION: No mammographic evidence of malignancy. A  result letter of this screening mammogram will be mailed directly to the patient. RECOMMENDATION: Screening mammogram in one year. (Code:SM-B-01Y) BI-RADS CATEGORY  1: Negative. Electronically Signed   By: Bary Richard M.D.   On: 11/05/2022 15:29    ROS 14 pt review of systems performed and negative (unless mentioned in an HPI)  Objective: BP 135/81   Pulse 70   Temp 97.9 F (36.6 C)   Ht 5' 4.57" (1.64 m)   Wt 151 lb 3.2 oz (68.6 kg)   SpO2 99%   BMI 25.50 kg/m  Physical Exam Vitals and nursing note reviewed.  Constitutional:      General: She is not in acute distress.    Appearance: Normal appearance. She is not ill-appearing, toxic-appearing or diaphoretic.  HENT:     Head: Normocephalic and atraumatic.  Eyes:     General: No scleral icterus.       Right eye: No discharge.        Left eye: No discharge.     Extraocular Movements: Extraocular movements intact.     Conjunctiva/sclera: Conjunctivae normal.     Pupils: Pupils are equal, round, and reactive to light.  Cardiovascular:     Rate and Rhythm: Normal rate and regular rhythm.  Pulmonary:     Effort: Pulmonary effort is normal. No respiratory distress.      Breath sounds: Normal breath sounds. No wheezing, rhonchi or rales.  Musculoskeletal:     Right lower leg: No edema.     Left lower leg: No edema.  Skin:    General: Skin is warm.     Findings: No rash.  Neurological:     Mental Status: She is alert and oriented to person, place, and time. Mental status is at baseline.     Motor: No weakness.     Gait: Gait normal.  Psychiatric:        Mood and Affect: Mood normal.        Behavior: Behavior normal.        Thought Content: Thought content normal.        Judgment: Judgment normal.    No results found.  Assessment/plan: ADDELIN SAENZ is a 67 y.o. female present for [facility] HTN/HLD Stable.   Her pressures are borderline here but reported pressures from other offices and home pressures are normal.  She will continue to monitor at home and if she notices pressures above 135 systolic she will come in to be seen so we can adjust medication.  She will also bring her blood pressure cuff with her to her next appointment so we can ensure for accuracy. Continue routine exercise and low-sodium diet. Continue triamterene-HCTZ Cbc, cmp, tsh, lipids collected today Cardiac CT: 10/29/2022- ZERO, consider rpt 5 years Follow-up in 5.5 months  Osteopenia, unspecified location Continue vitamin D and calcium supplement Continue routine exercise Limit caffeine and alcohol Vitamin D levels UTD and normal DEXA up-to-date, next DEXA due 2025/2026  Decreased estrogen level Stable Continue  estradiol 0.1- 2 times weekly   Return in about 24 weeks (around 12/01/2023) for Routine chronic condition follow-up.  Orders Placed This Encounter  Procedures   Hemoglobin A1c   Comprehensive metabolic panel   CBC with Differential/Platelet   TSH   Lipid panel   Ambulatory referral to Gastroenterology   Meds ordered this encounter  Medications   estradiol (ESTRACE) 0.1 MG/GM vaginal cream    Sig: 2x weekly    Dispense:  42.5 g    Refill:  11    triamterene-hydrochlorothiazide (MAXZIDE-25) 37.5-25 MG tablet    Sig: Take 1 tablet by mouth daily.    Dispense:  90 tablet    Refill:  1   Referral Orders         Ambulatory referral to Gastroenterology       Note is dictated utilizing voice recognition software. Although note has been proof read prior to signing, occasional typographical errors still can be missed. If any questions arise, please do not hesitate to call for verification.  Electronically signed by: Felix Pacini, DO [facility] [place]

## 2023-06-16 NOTE — Patient Instructions (Addendum)
Return in about 24 weeks (around 01/20/2023) for Routine chronic condition follow-up.        Great to see you today.  I have refilled the medication(s) we provide.   If labs were collected, we will inform you of lab results once received either by echart message or telephone call.   - echart message- for normal results that have been seen by the patient already.   - telephone call: abnormal results or if patient has not viewed results in their echart.  

## 2023-06-17 ENCOUNTER — Telehealth: Payer: Self-pay | Admitting: Family Medicine

## 2023-06-17 MED ORDER — LOSARTAN POTASSIUM 25 MG PO TABS: 12.5000 mg | ORAL_TABLET | Freq: Every day | ORAL | 0 refills | Status: DC

## 2023-06-17 NOTE — Telephone Encounter (Signed)
Spoke with patient regarding results/recommendations.  

## 2023-06-17 NOTE — Telephone Encounter (Signed)
Please call patient Liver, kidney and thyroid function are normal Blood cell counts are normal Sodium has decreased more, I believe this is coming from her blood pressure medication and we need to change to a different blood pressure medication. Diabetes screening/A1c is normal  Cholesterol-  her HDL is great 83 (good cholesterol),  her bad cholesterol/LDL increased to 145. Her ratio is  3, which is great. No need for medications.   Follow up in 4 weeks for BP recheck by nurse visit.

## 2023-06-19 DIAGNOSIS — M9905 Segmental and somatic dysfunction of pelvic region: Secondary | ICD-10-CM | POA: Diagnosis not present

## 2023-06-19 DIAGNOSIS — M9904 Segmental and somatic dysfunction of sacral region: Secondary | ICD-10-CM | POA: Diagnosis not present

## 2023-06-19 DIAGNOSIS — M7632 Iliotibial band syndrome, left leg: Secondary | ICD-10-CM | POA: Diagnosis not present

## 2023-06-19 DIAGNOSIS — M9906 Segmental and somatic dysfunction of lower extremity: Secondary | ICD-10-CM | POA: Diagnosis not present

## 2023-06-19 DIAGNOSIS — M25552 Pain in left hip: Secondary | ICD-10-CM | POA: Diagnosis not present

## 2023-06-22 DIAGNOSIS — R29898 Other symptoms and signs involving the musculoskeletal system: Secondary | ICD-10-CM | POA: Diagnosis not present

## 2023-06-22 DIAGNOSIS — M79652 Pain in left thigh: Secondary | ICD-10-CM | POA: Diagnosis not present

## 2023-06-22 DIAGNOSIS — R262 Difficulty in walking, not elsewhere classified: Secondary | ICD-10-CM | POA: Diagnosis not present

## 2023-06-22 DIAGNOSIS — M25652 Stiffness of left hip, not elsewhere classified: Secondary | ICD-10-CM | POA: Diagnosis not present

## 2023-06-22 DIAGNOSIS — M4807 Spinal stenosis, lumbosacral region: Secondary | ICD-10-CM | POA: Diagnosis not present

## 2023-06-29 DIAGNOSIS — M79652 Pain in left thigh: Secondary | ICD-10-CM | POA: Diagnosis not present

## 2023-06-29 DIAGNOSIS — R262 Difficulty in walking, not elsewhere classified: Secondary | ICD-10-CM | POA: Diagnosis not present

## 2023-06-29 DIAGNOSIS — M4807 Spinal stenosis, lumbosacral region: Secondary | ICD-10-CM | POA: Diagnosis not present

## 2023-06-29 DIAGNOSIS — R29898 Other symptoms and signs involving the musculoskeletal system: Secondary | ICD-10-CM | POA: Diagnosis not present

## 2023-06-29 DIAGNOSIS — M25652 Stiffness of left hip, not elsewhere classified: Secondary | ICD-10-CM | POA: Diagnosis not present

## 2023-07-01 DIAGNOSIS — M7632 Iliotibial band syndrome, left leg: Secondary | ICD-10-CM | POA: Diagnosis not present

## 2023-07-01 DIAGNOSIS — R29898 Other symptoms and signs involving the musculoskeletal system: Secondary | ICD-10-CM | POA: Diagnosis not present

## 2023-07-01 DIAGNOSIS — M79652 Pain in left thigh: Secondary | ICD-10-CM | POA: Diagnosis not present

## 2023-07-01 DIAGNOSIS — M25562 Pain in left knee: Secondary | ICD-10-CM | POA: Diagnosis not present

## 2023-07-01 DIAGNOSIS — M9904 Segmental and somatic dysfunction of sacral region: Secondary | ICD-10-CM | POA: Diagnosis not present

## 2023-07-01 DIAGNOSIS — M9906 Segmental and somatic dysfunction of lower extremity: Secondary | ICD-10-CM | POA: Diagnosis not present

## 2023-07-01 DIAGNOSIS — R262 Difficulty in walking, not elsewhere classified: Secondary | ICD-10-CM | POA: Diagnosis not present

## 2023-07-01 DIAGNOSIS — M25652 Stiffness of left hip, not elsewhere classified: Secondary | ICD-10-CM | POA: Diagnosis not present

## 2023-07-01 DIAGNOSIS — M4807 Spinal stenosis, lumbosacral region: Secondary | ICD-10-CM | POA: Diagnosis not present

## 2023-07-01 DIAGNOSIS — M25552 Pain in left hip: Secondary | ICD-10-CM | POA: Diagnosis not present

## 2023-07-01 DIAGNOSIS — M9905 Segmental and somatic dysfunction of pelvic region: Secondary | ICD-10-CM | POA: Diagnosis not present

## 2023-07-09 DIAGNOSIS — M79652 Pain in left thigh: Secondary | ICD-10-CM | POA: Diagnosis not present

## 2023-07-09 DIAGNOSIS — R29898 Other symptoms and signs involving the musculoskeletal system: Secondary | ICD-10-CM | POA: Diagnosis not present

## 2023-07-09 DIAGNOSIS — R262 Difficulty in walking, not elsewhere classified: Secondary | ICD-10-CM | POA: Diagnosis not present

## 2023-07-09 DIAGNOSIS — M25652 Stiffness of left hip, not elsewhere classified: Secondary | ICD-10-CM | POA: Diagnosis not present

## 2023-07-09 DIAGNOSIS — M4807 Spinal stenosis, lumbosacral region: Secondary | ICD-10-CM | POA: Diagnosis not present

## 2023-07-12 ENCOUNTER — Encounter: Payer: Self-pay | Admitting: Family Medicine

## 2023-07-13 DIAGNOSIS — M9905 Segmental and somatic dysfunction of pelvic region: Secondary | ICD-10-CM | POA: Diagnosis not present

## 2023-07-13 DIAGNOSIS — M9907 Segmental and somatic dysfunction of upper extremity: Secondary | ICD-10-CM | POA: Diagnosis not present

## 2023-07-13 DIAGNOSIS — M9906 Segmental and somatic dysfunction of lower extremity: Secondary | ICD-10-CM | POA: Diagnosis not present

## 2023-07-13 DIAGNOSIS — M25552 Pain in left hip: Secondary | ICD-10-CM | POA: Diagnosis not present

## 2023-07-13 DIAGNOSIS — M79652 Pain in left thigh: Secondary | ICD-10-CM | POA: Diagnosis not present

## 2023-07-13 DIAGNOSIS — M9904 Segmental and somatic dysfunction of sacral region: Secondary | ICD-10-CM | POA: Diagnosis not present

## 2023-07-13 NOTE — Telephone Encounter (Signed)
Not a different med at this low dose that is not also a pill that would need cut.

## 2023-07-15 ENCOUNTER — Ambulatory Visit (INDEPENDENT_AMBULATORY_CARE_PROVIDER_SITE_OTHER): Payer: Medicare Other

## 2023-07-15 VITALS — BP 120/65

## 2023-07-15 DIAGNOSIS — I10 Essential (primary) hypertension: Secondary | ICD-10-CM | POA: Diagnosis not present

## 2023-07-15 MED ORDER — LOSARTAN POTASSIUM 25 MG PO TABS: 12.5000 mg | ORAL_TABLET | Freq: Every day | ORAL | 1 refills | Status: DC

## 2023-07-15 NOTE — Addendum Note (Signed)
Addended by: Felix Pacini A on: 07/15/2023 03:56 PM   Modules accepted: Orders, Level of Service

## 2023-07-15 NOTE — Progress Notes (Signed)
  Pt here for Blood pressure check per Dr Claiborne Billings   Pt currently takes: Losartan 25MG      Pt reports compliance with medication.   BP today @ =120/65   Please call patient Blood pressure today is at goal. Continue losartan 12.5 mg daily Follow-up in about 6 months on hypertension.  Refills have been provided for her.   Electronically Signed by: Felix Pacini, DO [facility] [place]

## 2023-07-15 NOTE — Progress Notes (Signed)
Pt made aware

## 2023-07-15 NOTE — Progress Notes (Signed)
Pt here for Blood pressure check per Dr Claiborne Billings  Pt currently takes: Losartan 25MG    Pt reports compliance with medication.  BP today @ =120/65

## 2023-07-27 DIAGNOSIS — M79652 Pain in left thigh: Secondary | ICD-10-CM | POA: Diagnosis not present

## 2023-07-27 DIAGNOSIS — R29898 Other symptoms and signs involving the musculoskeletal system: Secondary | ICD-10-CM | POA: Diagnosis not present

## 2023-07-27 DIAGNOSIS — R262 Difficulty in walking, not elsewhere classified: Secondary | ICD-10-CM | POA: Diagnosis not present

## 2023-07-27 DIAGNOSIS — M25652 Stiffness of left hip, not elsewhere classified: Secondary | ICD-10-CM | POA: Diagnosis not present

## 2023-07-27 DIAGNOSIS — M4807 Spinal stenosis, lumbosacral region: Secondary | ICD-10-CM | POA: Diagnosis not present

## 2023-07-29 ENCOUNTER — Encounter (INDEPENDENT_AMBULATORY_CARE_PROVIDER_SITE_OTHER): Payer: Self-pay

## 2023-07-29 DIAGNOSIS — M9906 Segmental and somatic dysfunction of lower extremity: Secondary | ICD-10-CM | POA: Diagnosis not present

## 2023-07-29 DIAGNOSIS — M25552 Pain in left hip: Secondary | ICD-10-CM | POA: Diagnosis not present

## 2023-07-29 DIAGNOSIS — M79652 Pain in left thigh: Secondary | ICD-10-CM | POA: Diagnosis not present

## 2023-07-29 DIAGNOSIS — M9905 Segmental and somatic dysfunction of pelvic region: Secondary | ICD-10-CM | POA: Diagnosis not present

## 2023-07-30 ENCOUNTER — Other Ambulatory Visit: Payer: Self-pay | Admitting: Oncology

## 2023-07-30 DIAGNOSIS — Z006 Encounter for examination for normal comparison and control in clinical research program: Secondary | ICD-10-CM

## 2023-08-07 DIAGNOSIS — M25652 Stiffness of left hip, not elsewhere classified: Secondary | ICD-10-CM | POA: Diagnosis not present

## 2023-08-07 DIAGNOSIS — M79652 Pain in left thigh: Secondary | ICD-10-CM | POA: Diagnosis not present

## 2023-08-07 DIAGNOSIS — R29898 Other symptoms and signs involving the musculoskeletal system: Secondary | ICD-10-CM | POA: Diagnosis not present

## 2023-08-07 DIAGNOSIS — M4807 Spinal stenosis, lumbosacral region: Secondary | ICD-10-CM | POA: Diagnosis not present

## 2023-08-07 DIAGNOSIS — R262 Difficulty in walking, not elsewhere classified: Secondary | ICD-10-CM | POA: Diagnosis not present

## 2023-08-14 DIAGNOSIS — M9905 Segmental and somatic dysfunction of pelvic region: Secondary | ICD-10-CM | POA: Diagnosis not present

## 2023-08-14 DIAGNOSIS — M9906 Segmental and somatic dysfunction of lower extremity: Secondary | ICD-10-CM | POA: Diagnosis not present

## 2023-08-14 DIAGNOSIS — M9904 Segmental and somatic dysfunction of sacral region: Secondary | ICD-10-CM | POA: Diagnosis not present

## 2023-08-14 DIAGNOSIS — M7632 Iliotibial band syndrome, left leg: Secondary | ICD-10-CM | POA: Diagnosis not present

## 2023-08-14 DIAGNOSIS — M25552 Pain in left hip: Secondary | ICD-10-CM | POA: Diagnosis not present

## 2023-08-19 ENCOUNTER — Ambulatory Visit (INDEPENDENT_AMBULATORY_CARE_PROVIDER_SITE_OTHER): Payer: Medicare Other

## 2023-08-19 VITALS — Wt 151.0 lb

## 2023-08-19 DIAGNOSIS — Z0000 Encounter for general adult medical examination without abnormal findings: Secondary | ICD-10-CM | POA: Diagnosis not present

## 2023-08-19 NOTE — Patient Instructions (Signed)
Sarah Frost , Thank you for taking time to come for your Medicare Wellness Visit. I appreciate your ongoing commitment to your health goals. Please review the following plan we discussed and let me know if I can assist you in the future.   Referrals/Orders/Follow-Ups/Clinician Recommendations: lse a few pounds  This is a list of the screening recommended for you and due dates:  Health Maintenance  Topic Date Due   Zoster (Shingles) Vaccine (2 of 2) 12/24/2021   Colon Cancer Screening  06/11/2023   Flu Shot  07/30/2023   Medicare Annual Wellness Visit  08/18/2024   Mammogram  11/03/2024   DEXA scan (bone density measurement)  01/06/2025   DTaP/Tdap/Td vaccine (2 - Td or Tdap) 09/17/2025   Pneumonia Vaccine  Completed   Hepatitis C Screening  Completed   HPV Vaccine  Aged Out   COVID-19 Vaccine  Discontinued    Advanced directives: (Copy Requested) Please bring a copy of your health care power of attorney and living will to the office to be added to your chart at your convenience.  Next Medicare Annual Wellness Visit scheduled for next year: Yes

## 2023-08-19 NOTE — Progress Notes (Signed)
Subjective:   Sarah Frost is a 67 y.o. female who presents for Medicare Annual (Subsequent) preventive examination.  Visit Complete: Virtual  I connected with  Sarah Frost on 08/19/23 by a audio enabled telemedicine application and verified that I am speaking with the correct person using two identifiers.  Patient Location: Home  Provider Location: Home Office  I discussed the limitations of evaluation and management by telemedicine. The patient expressed understanding and agreed to proceed.  Patient Medicare AWV questionnaire was completed by the patient on 08/16/23; I have confirmed that all information answered by patient is correct and no changes since this date.  Vital Signs: Unable to obtain new vitals due to this being a telehealth visit.   Review of Systems     Cardiac Risk Factors include: advanced age (>5men, >18 women);dyslipidemia;hypertension     Objective:    Today's Vitals   08/19/23 1301  Weight: 151 lb (68.5 kg)   Body mass index is 25.47 kg/m.     08/19/2023    1:05 PM 08/06/2022   11:37 AM  Advanced Directives  Does Patient Have a Medical Advance Directive? Yes Yes  Type of Estate agent of [place];Living will Healthcare Power of Attorney  Copy of Healthcare Power of Attorney in Chart? No - copy requested No - copy requested    Current Medications (verified) Outpatient Encounter Medications as of 08/19/2023  Medication Sig   calcium carbonate (OSCAL) 1500 (600 Ca) MG TABS tablet 1 tablet with meals   estradiol (ESTRACE) 0.1 MG/GM vaginal cream 2x weekly   loratadine (CLARITIN) 10 MG tablet 1 tablet   losartan (COZAAR) 25 MG tablet Take 0.5 tablets (12.5 mg total) by mouth daily.   magnesium citrate SOLN Take 296 mLs by mouth once.   No facility-administered encounter medications on file as of 08/19/2023.    Allergies (verified) Penicillins   History: Past Medical History:  Diagnosis Date   Allergy    Arthritis  2014   Chicken pox    Colon polyps    History of cystocele 10/30/2020   Hyperlipidemia, unspecified    Hypertension 2012   Migraine 10/30/2020   Ovarian cyst    Squamous cell skin cancer, face    Past Surgical History:  Procedure Laterality Date   OOPHORECTOMY Right 2004   large cyst of ovary-benign   Family History  Problem Relation Age of Onset   Hyperlipidemia Mother    Arthritis Mother    Heart disease Mother    Hypertension Mother    Vision loss Mother    Varicose Veins Mother    Hyperlipidemia Father    Arthritis Father    Heart disease Father    Hypertension Father    Heart attack Father    Arthritis Brother    Hyperlipidemia Brother    Hypertension Brother    Healthy Daughter        [place], [place] and [place]   Drug abuse Son    Colon cancer Maternal Aunt    Hyperlipidemia Maternal Grandmother    Hypertension Maternal Grandmother    Heart attack Maternal Grandmother    Heart disease Maternal Grandmother    Colon cancer Maternal Grandfather    Hyperlipidemia Paternal Grandmother    Hypertension Paternal Grandmother    Heart attack Paternal Grandmother    Heart disease Paternal Grandmother    Hypertension Paternal Grandfather    Hyperlipidemia Paternal Grandfather    Heart attack Paternal Grandfather    Heart disease Paternal Grandfather  Social History   Socioeconomic History   Marital status: Married    Spouse name: Not on file   Number of children: Not on file   Years of education: Not on file   Highest education level: Bachelor's degree (e.g., BA, AB, BS)  Occupational History   Not on file  Tobacco Use   Smoking status: Never    Passive exposure: Never   Smokeless tobacco: Never  Vaping Use   Vaping status: Never Used  Substance and Sexual Activity   Alcohol use: Yes    Alcohol/week: 8.0 standard drinks of alcohol    Types: 8 Glasses of wine per week   Drug use: Never   Sexual activity: Not Currently    Partners: Male    Birth  control/protection: None  Other Topics Concern   Not on file  Social History Narrative   Marital status/children/pets: Married   Education/employment: B.A., employed in  Engineer, manufacturing:      -Wears a bicycle helmet riding a bike: Yes     -smoke alarm in the home:Yes     - wears seatbelt: Yes     - Feels safe in their relationships: Yes      Social Determinants of Health   Financial Resource Strain: Low Risk  (08/16/2023)   Overall Financial Resource Strain (CARDIA)    Difficulty of Paying Living Expenses: Not hard at all  Food Insecurity: No Food Insecurity (08/16/2023)   Hunger Vital Sign    Worried About Running Out of Food in the Last Year: Never true    Ran Out of Food in the Last Year: Never true  Transportation Needs: No Transportation Needs (08/16/2023)   PRAPARE - Administrator, Civil Service (Medical): No    Lack of Transportation (Non-Medical): No  Physical Activity: Sufficiently Active (08/16/2023)   Exercise Vital Sign    Days of Exercise per Week: 7 days    Minutes of Exercise per Session: 60 min  Stress: No Stress Concern Present (08/16/2023)   [employer] of Occupational Health - Occupational Stress Questionnaire    Feeling of Stress : Not at all  Social Connections: Socially Integrated (08/16/2023)   Social Connection and Isolation Panel [NHANES]    Frequency of Communication with Friends and Family: More than three times a week    Frequency of Social Gatherings with Friends and Family: More than three times a week    Attends Religious Services: 1 to 4 times per year    Active Member of [employer] or Organizations: Yes    Attends Engineer, structural: More than 4 times per year    Marital Status: Married    Tobacco Counseling Counseling given: Not Answered   Clinical Intake:  Pre-visit preparation completed: Yes  Pain : No/denies pain     BMI - recorded: 25.47 Nutritional Status: BMI 25 -29 Overweight Nutritional  Risks: None Diabetes: No  How often do you need to have someone help you when you read instructions, pamphlets, or other written materials from your doctor or pharmacy?: 1 - Never  Interpreter Needed?: No  Information entered by :: Lanier Ensign, LPN   Activities of Daily Living    08/16/2023   10:17 AM  In your present state of health, do you have any difficulty performing the following activities:  Hearing? 0  Vision? 0  Difficulty concentrating or making decisions? 0  Walking or climbing stairs? 0  Dressing or bathing? 0  Doing errands, shopping? 0  Preparing Food and eating ? N  Using the Toilet? N  In the past six months, have you accidently leaked urine? N  Do you have problems with loss of bowel control? N  Managing your Medications? N  Managing your Finances? N  Housekeeping or managing your Housekeeping? N    Patient Care Team: Natalia Leatherwood, DO as PCP - General (Family Medicine) [facility] as Consulting Physician (Dermatology) Elon Spanner Madelaine Etienne, MD as Consulting Physician (Obstetrics and Gynecology) Rodrigo Ran, [place] (Ophthalmology) Kerin Salen, MD as Consulting Physician (Gastroenterology) [facility] as Consulting Physician (Peripheral Vascular Disease) Andrena Mews, DO as Referring Physician (Sports Medicine)  Indicate any recent Medical Services you may have received from other than [facility] providers in the past year (date may be approximate).     Assessment:   This is a routine wellness examination for [place].  Hearing/Vision screen Hearing Screening - Comments:: Pt denies any hearing issues  Vision Screening - Comments:: t follows up with Dr Parke Simmers for annual eye exams  Dietary issues and exercise activities discussed:     Goals Addressed             This Visit's Progress    Patient Stated       To lose a little weight        Depression Screen    08/19/2023    1:04 PM 06/16/2023    9:58 AM 08/06/2022    11:35 AM 04/09/2022    8:46 AM  PHQ 2/9 Scores  PHQ - 2 Score 0 0 0 0    Fall Risk    08/16/2023   10:17 AM 06/16/2023    9:58 AM 09/07/2022   11:41 AM 08/06/2022   11:39 AM 04/09/2022    8:46 AM  Fall Risk   Falls in the past year? 0 0 0 0 0  Number falls in past yr: 0 0  0 0  Injury with Fall? 0 0  0 0  Risk for fall due to : Impaired vision   Impaired vision;Impaired balance/gait No Fall Risks  Risk for fall due to: Comment    working with PT and sport dr   Follow up Falls prevention discussed   Falls prevention discussed Falls evaluation completed    MEDICARE RISK AT HOME: Medicare Risk at Home Any stairs in or around the home?: Yes If so, are there any without handrails?: Yes Home free of loose throw rugs in walkways, pet beds, electrical cords, etc?: Yes Adequate lighting in your home to reduce risk of falls?: Yes Life alert?: No Use of a cane, walker or w/c?: No Grab bars in the bathroom?: No Shower chair or bench in shower?: Yes Elevated toilet seat or a handicapped toilet?: No  TIMED UP AND GO:  Was the test performed?  No    Cognitive Function:        08/19/2023    1:06 PM 08/06/2022   11:41 AM  6CIT Screen  What Year? 0 points 0 points  What month? 0 points 0 points  What time? 0 points 0 points  Count back from 20 0 points 0 points  Months in reverse 0 points 0 points  Repeat phrase 0 points 0 points  Total Score 0 points 0 points    Immunizations Immunization History  Administered Date(s) Administered   Fluad Quad(high Dose 65+) 09/17/2022   Influenza Split 11/01/2018, 09/16/2021   Influenza,inj,Quad PF,6+ Mos 10/20/2016, 11/01/2018, 10/04/2020   Influenza,inj,quad, With Preservative 09/18/2015  PFIZER(Purple Top)SARS-COV-2 Vaccination 03/04/2020, 03/27/2020, 10/04/2020, 04/17/2021, 09/16/2021   PNEUMOCOCCAL CONJUGATE-20 08/01/2021   Tdap 09/18/2015   Zoster Recombinant(Shingrix) 08/28/2021, 10/29/2021   Zoster, Live 08/28/2021, 10/29/2021     TDAP status: Up to date  Flu Vaccine status: Due, Education has been provided regarding the importance of this vaccine. Advised may receive this vaccine at local pharmacy or Health Dept. Aware to provide a copy of the vaccination record if obtained from local pharmacy or Health Dept. Verbalized acceptance and understanding.  Pneumococcal vaccine status: Up to date  Covid-19 vaccine status: Completed vaccines  Qualifies for Shingles Vaccine? Yes   Zostavax completed Yes   Shingrix Completed?: Yes  Screening Tests Health Maintenance  Topic Date Due   Zoster Vaccines- Shingrix (2 of 2) 12/24/2021   Colonoscopy  06/11/2023   INFLUENZA VACCINE  07/30/2023   Medicare Annual Wellness (AWV)  08/18/2024   MAMMOGRAM  11/03/2024   DEXA SCAN  01/06/2025   DTaP/Tdap/Td (2 - Td or Tdap) 09/17/2025   Pneumonia Vaccine 38+ Years old  Completed   Hepatitis C Screening  Completed   HPV VACCINES  Aged Out   COVID-19 Vaccine  Discontinued    Health Maintenance  Health Maintenance Due  Topic Date Due   Zoster Vaccines- Shingrix (2 of 2) 12/24/2021   Colonoscopy  06/11/2023   INFLUENZA VACCINE  07/30/2023    Colorectal cancer screening: Referral to GI placed scheduled 08/21/23. Pt aware the office will call re: appt.  Mammogram status: Completed 11/03/22. Repeat every year  Bone Density status: Completed 01/06/22. Results reflect: Bone density results: OSTEOPENIA. Repeat every 2 years.   Additional Screening:  Hepatitis C Screening:  Completed 08/10/21  Vision Screening: Recommended annual ophthalmology exams for early detection of glaucoma and other disorders of the eye. Is the patient up to date with their annual eye exam?  Yes  Who is the provider or what is the name of the office in which the patient attends annual eye exams? Dr Parke Simmers If pt is not established with a provider, would they like to be referred to a provider to establish care? No .   Dental Screening: Recommended  annual dental exams for proper oral hygiene    Community Resource Referral / Chronic Care Management: CRR required this visit?  No   CCM required this visit?  No     Plan:     I have personally reviewed and noted the following in the patient's chart:   Medical and social history Use of alcohol, tobacco or illicit drugs  Current medications and supplements including opioid prescriptions. Patient is not currently taking opioid prescriptions. Functional ability and status Nutritional status Physical activity Advanced directives List of other physicians Hospitalizations, surgeries, and ER visits in previous 12 months Vitals Screenings to include cognitive, depression, and falls Referrals and appointments  In addition, I have reviewed and discussed with patient certain preventive protocols, quality metrics, and best practice recommendations. A written personalized care plan for preventive services as well as general preventive health recommendations were provided to patient.     Marzella Schlein, LPN   1/61/0960   After Visit Summary: (MyChart) Due to this being a telephonic visit, the after visit summary with patients personalized plan was offered to patient via MyChart   Nurse Notes: none

## 2023-08-21 DIAGNOSIS — Z09 Encounter for follow-up examination after completed treatment for conditions other than malignant neoplasm: Secondary | ICD-10-CM | POA: Diagnosis not present

## 2023-08-21 DIAGNOSIS — D123 Benign neoplasm of transverse colon: Secondary | ICD-10-CM | POA: Diagnosis not present

## 2023-08-21 DIAGNOSIS — K648 Other hemorrhoids: Secondary | ICD-10-CM | POA: Diagnosis not present

## 2023-08-21 DIAGNOSIS — Z86010 Personal history of colonic polyps: Secondary | ICD-10-CM | POA: Diagnosis not present

## 2023-08-21 LAB — HM COLONOSCOPY

## 2023-08-24 DIAGNOSIS — M79652 Pain in left thigh: Secondary | ICD-10-CM | POA: Diagnosis not present

## 2023-08-24 DIAGNOSIS — R29898 Other symptoms and signs involving the musculoskeletal system: Secondary | ICD-10-CM | POA: Diagnosis not present

## 2023-08-24 DIAGNOSIS — M4807 Spinal stenosis, lumbosacral region: Secondary | ICD-10-CM | POA: Diagnosis not present

## 2023-08-24 DIAGNOSIS — R262 Difficulty in walking, not elsewhere classified: Secondary | ICD-10-CM | POA: Diagnosis not present

## 2023-08-24 DIAGNOSIS — M25652 Stiffness of left hip, not elsewhere classified: Secondary | ICD-10-CM | POA: Diagnosis not present

## 2023-08-25 DIAGNOSIS — D123 Benign neoplasm of transverse colon: Secondary | ICD-10-CM | POA: Diagnosis not present

## 2023-09-02 DIAGNOSIS — M79652 Pain in left thigh: Secondary | ICD-10-CM | POA: Diagnosis not present

## 2023-09-02 DIAGNOSIS — M9905 Segmental and somatic dysfunction of pelvic region: Secondary | ICD-10-CM | POA: Diagnosis not present

## 2023-09-02 DIAGNOSIS — M25562 Pain in left knee: Secondary | ICD-10-CM | POA: Diagnosis not present

## 2023-09-02 DIAGNOSIS — M9904 Segmental and somatic dysfunction of sacral region: Secondary | ICD-10-CM | POA: Diagnosis not present

## 2023-09-02 DIAGNOSIS — M9906 Segmental and somatic dysfunction of lower extremity: Secondary | ICD-10-CM | POA: Diagnosis not present

## 2023-09-02 DIAGNOSIS — M25552 Pain in left hip: Secondary | ICD-10-CM | POA: Diagnosis not present

## 2023-09-02 DIAGNOSIS — M19041 Primary osteoarthritis, right hand: Secondary | ICD-10-CM | POA: Diagnosis not present

## 2023-09-18 DIAGNOSIS — Z23 Encounter for immunization: Secondary | ICD-10-CM | POA: Diagnosis not present

## 2023-09-29 DIAGNOSIS — M9905 Segmental and somatic dysfunction of pelvic region: Secondary | ICD-10-CM | POA: Diagnosis not present

## 2023-09-29 DIAGNOSIS — M9904 Segmental and somatic dysfunction of sacral region: Secondary | ICD-10-CM | POA: Diagnosis not present

## 2023-09-29 DIAGNOSIS — M9903 Segmental and somatic dysfunction of lumbar region: Secondary | ICD-10-CM | POA: Diagnosis not present

## 2023-09-29 DIAGNOSIS — M25552 Pain in left hip: Secondary | ICD-10-CM | POA: Diagnosis not present

## 2023-09-29 DIAGNOSIS — M7632 Iliotibial band syndrome, left leg: Secondary | ICD-10-CM | POA: Diagnosis not present

## 2023-09-29 DIAGNOSIS — M79641 Pain in right hand: Secondary | ICD-10-CM | POA: Diagnosis not present

## 2023-09-29 DIAGNOSIS — M19041 Primary osteoarthritis, right hand: Secondary | ICD-10-CM | POA: Diagnosis not present

## 2023-09-29 DIAGNOSIS — M9906 Segmental and somatic dysfunction of lower extremity: Secondary | ICD-10-CM | POA: Diagnosis not present

## 2023-10-13 DIAGNOSIS — M9906 Segmental and somatic dysfunction of lower extremity: Secondary | ICD-10-CM | POA: Diagnosis not present

## 2023-10-13 DIAGNOSIS — M9904 Segmental and somatic dysfunction of sacral region: Secondary | ICD-10-CM | POA: Diagnosis not present

## 2023-10-13 DIAGNOSIS — M9905 Segmental and somatic dysfunction of pelvic region: Secondary | ICD-10-CM | POA: Diagnosis not present

## 2023-10-13 DIAGNOSIS — M7632 Iliotibial band syndrome, left leg: Secondary | ICD-10-CM | POA: Diagnosis not present

## 2023-10-13 DIAGNOSIS — M25552 Pain in left hip: Secondary | ICD-10-CM | POA: Diagnosis not present

## 2023-10-14 ENCOUNTER — Other Ambulatory Visit: Payer: Self-pay | Admitting: Family Medicine

## 2023-10-14 DIAGNOSIS — Z1231 Encounter for screening mammogram for malignant neoplasm of breast: Secondary | ICD-10-CM

## 2023-11-03 ENCOUNTER — Other Ambulatory Visit: Payer: Self-pay | Admitting: Sports Medicine

## 2023-11-03 DIAGNOSIS — M7989 Other specified soft tissue disorders: Secondary | ICD-10-CM | POA: Diagnosis not present

## 2023-11-03 DIAGNOSIS — M1990 Unspecified osteoarthritis, unspecified site: Secondary | ICD-10-CM

## 2023-11-03 DIAGNOSIS — M19041 Primary osteoarthritis, right hand: Secondary | ICD-10-CM | POA: Diagnosis not present

## 2023-11-03 DIAGNOSIS — M79641 Pain in right hand: Secondary | ICD-10-CM | POA: Diagnosis not present

## 2023-11-03 DIAGNOSIS — M1811 Unilateral primary osteoarthritis of first carpometacarpal joint, right hand: Secondary | ICD-10-CM

## 2023-11-04 ENCOUNTER — Ambulatory Visit
Admission: RE | Admit: 2023-11-04 | Discharge: 2023-11-04 | Disposition: A | Discharge status: Home / Self Care | Payer: Medicare Other | Source: Ambulatory Visit | Attending: Sports Medicine | Admitting: Sports Medicine

## 2023-11-04 DIAGNOSIS — M1811 Unilateral primary osteoarthritis of first carpometacarpal joint, right hand: Secondary | ICD-10-CM

## 2023-11-04 DIAGNOSIS — M79641 Pain in right hand: Secondary | ICD-10-CM | POA: Diagnosis not present

## 2023-11-04 DIAGNOSIS — M19041 Primary osteoarthritis, right hand: Secondary | ICD-10-CM | POA: Diagnosis not present

## 2023-11-10 DIAGNOSIS — M9904 Segmental and somatic dysfunction of sacral region: Secondary | ICD-10-CM | POA: Diagnosis not present

## 2023-11-10 DIAGNOSIS — M79641 Pain in right hand: Secondary | ICD-10-CM | POA: Diagnosis not present

## 2023-11-10 DIAGNOSIS — M7632 Iliotibial band syndrome, left leg: Secondary | ICD-10-CM | POA: Diagnosis not present

## 2023-11-10 DIAGNOSIS — Z1231 Encounter for screening mammogram for malignant neoplasm of breast: Secondary | ICD-10-CM

## 2023-11-10 DIAGNOSIS — M19041 Primary osteoarthritis, right hand: Secondary | ICD-10-CM | POA: Diagnosis not present

## 2023-11-10 DIAGNOSIS — M9906 Segmental and somatic dysfunction of lower extremity: Secondary | ICD-10-CM | POA: Diagnosis not present

## 2023-11-10 DIAGNOSIS — M9905 Segmental and somatic dysfunction of pelvic region: Secondary | ICD-10-CM | POA: Diagnosis not present

## 2023-11-10 DIAGNOSIS — M25552 Pain in left hip: Secondary | ICD-10-CM | POA: Diagnosis not present

## 2023-11-24 DIAGNOSIS — M79641 Pain in right hand: Secondary | ICD-10-CM | POA: Diagnosis not present

## 2023-11-24 DIAGNOSIS — M79642 Pain in left hand: Secondary | ICD-10-CM | POA: Diagnosis not present

## 2023-11-24 DIAGNOSIS — M25562 Pain in left knee: Secondary | ICD-10-CM | POA: Diagnosis not present

## 2023-11-24 DIAGNOSIS — M19042 Primary osteoarthritis, left hand: Secondary | ICD-10-CM | POA: Diagnosis not present

## 2023-11-24 DIAGNOSIS — M19041 Primary osteoarthritis, right hand: Secondary | ICD-10-CM | POA: Diagnosis not present

## 2023-11-24 DIAGNOSIS — M25552 Pain in left hip: Secondary | ICD-10-CM | POA: Diagnosis not present

## 2023-11-24 DIAGNOSIS — M5451 Vertebrogenic low back pain: Secondary | ICD-10-CM | POA: Diagnosis not present

## 2023-11-24 DIAGNOSIS — M1811 Unilateral primary osteoarthritis of first carpometacarpal joint, right hand: Secondary | ICD-10-CM | POA: Diagnosis not present

## 2023-12-01 ENCOUNTER — Ambulatory Visit
Admission: RE | Admit: 2023-12-01 | Discharge: 2023-12-01 | Disposition: A | Discharge status: Home / Self Care | Payer: Medicare Other | Source: Ambulatory Visit | Attending: Family Medicine | Admitting: Family Medicine

## 2023-12-01 ENCOUNTER — Encounter: Payer: Self-pay | Admitting: Family Medicine

## 2023-12-01 ENCOUNTER — Telehealth: Payer: Medicare Other | Admitting: Family Medicine

## 2023-12-01 DIAGNOSIS — Z91199 Patient's noncompliance with other medical treatment and regimen due to unspecified reason: Secondary | ICD-10-CM

## 2023-12-01 DIAGNOSIS — Z1231 Encounter for screening mammogram for malignant neoplasm of breast: Secondary | ICD-10-CM

## 2023-12-01 NOTE — Progress Notes (Signed)
   Patient had a virtual appointment for chronic condition management today.  Unfortunately patient was not successful in being able to connect to platform and appointment needed to be rescheduled. No charge

## 2023-12-01 NOTE — Telephone Encounter (Signed)
Pt has been changed to virtual.

## 2023-12-04 ENCOUNTER — Encounter: Payer: Self-pay | Admitting: Family Medicine

## 2023-12-04 ENCOUNTER — Ambulatory Visit (INDEPENDENT_AMBULATORY_CARE_PROVIDER_SITE_OTHER): Payer: Medicare Other | Admitting: Family Medicine

## 2023-12-04 VITALS — BP 128/72 | HR 66 | Temp 97.8°F | Wt 150.4 lb

## 2023-12-04 DIAGNOSIS — M8580 Other specified disorders of bone density and structure, unspecified site: Secondary | ICD-10-CM

## 2023-12-04 DIAGNOSIS — Z8249 Family history of ischemic heart disease and other diseases of the circulatory system: Secondary | ICD-10-CM | POA: Diagnosis not present

## 2023-12-04 DIAGNOSIS — I10 Essential (primary) hypertension: Secondary | ICD-10-CM | POA: Diagnosis not present

## 2023-12-04 DIAGNOSIS — E782 Mixed hyperlipidemia: Secondary | ICD-10-CM | POA: Diagnosis not present

## 2023-12-04 DIAGNOSIS — E871 Hypo-osmolality and hyponatremia: Secondary | ICD-10-CM

## 2023-12-04 LAB — BASIC METABOLIC PANEL
BUN: 13 mg/dL (ref 6–23)
CO2: 31 meq/L (ref 19–32)
Calcium: 9.5 mg/dL (ref 8.4–10.5)
Chloride: 101 meq/L (ref 96–112)
Creatinine, Ser: 0.53 mg/dL (ref 0.40–1.20)
GFR: 95.39 mL/min (ref >60.00)
Glucose, Bld: 67 mg/dL — ABNORMAL LOW (ref 70–99)
Potassium: 4.3 meq/L (ref 3.5–5.1)
Sodium: 138 meq/L (ref 135–145)

## 2023-12-04 MED ORDER — LOSARTAN POTASSIUM 25 MG PO TABS: 12.5000 mg | ORAL_TABLET | Freq: Every day | ORAL | 1 refills | Status: DC

## 2023-12-04 NOTE — Patient Instructions (Addendum)

## 2023-12-04 NOTE — Progress Notes (Signed)
Patient ID: Sarah Frost, female  DOB: [birth year], 67 y.o.   MRN: [id number] Patient Care Team    Relationship Specialty Notifications Start End  Natalia Leatherwood, DO PCP - General Family Medicine  04/09/22   [facility] Consulting Physician Dermatology  04/09/22   Ranae Pila, MD Consulting Physician Obstetrics and Gynecology  04/09/22   Rodrigo Ran, OD  Ophthalmology  04/09/22   Kerin Salen, MD Consulting Physician Gastroenterology  04/09/22   [facility] Consulting Physician Peripheral Vascular Disease  04/09/22   Andrena Mews, DO Referring Physician Sports Medicine  04/09/22     Chief Complaint  Patient presents with   Hypertension    Pt is not fasting    Subjective:  Sarah Frost is a 67 y.o. female present for Chronic Conditions/illness Management All past medical history, surgical history, allergies, family history, immunizations, medications and social history were updated in the electronic medical record today. All recent labs, ED visits and hospitalizations within the last year were reviewed.  HTN/HLD: Pt reports compliance with losartan 12.5 mg daily.  Patient denies chest pain, shortness of breath, dizziness or lower extremity edema.  Patient had been on Maxide in the past and was having continued electrolyte disturbances which required change in therapy. Pt does not take a daily baby ASA. Pt is not prescribed statin. Diet: Low-sodium RF:fh heart disease, HTN, HLD  Osteopenia: T score -1.3 completed January 2023.  She does supplement with calcium and vitamin D.  She exercises routinely.  Estrogen deficiency: Patient reports she would like to continue her Estrace 0.1 mg/GM vaginal cream twice weekly.  No negative side effects up-to-date with mammograms     08/19/2023    1:04 PM 06/16/2023    9:58 AM 08/06/2022   11:35 AM 04/09/2022    8:46 AM  Depression screen PHQ 2/9  Decreased Interest 0 0 0 0  Down, Depressed, Hopeless 0 0  0 0  PHQ - 2 Score 0 0 0 0       No data to display                11/30/2023   12:41 PM 08/16/2023   10:17 AM 06/16/2023    9:58 AM 09/07/2022   11:41 AM 08/06/2022   11:39 AM  Fall Risk   Falls in the past year? 0 0 0 0 0  Number falls in past yr:  0 0  0  Injury with Fall?  0 0  0  Risk for fall due to :  Impaired vision   Impaired vision;Impaired balance/gait  Risk for fall due to: Comment     working with PT and sport dr  Follow up  Falls prevention discussed   Falls prevention discussed    Immunization History  Administered Date(s) Administered   Fluad Quad(high Dose 65+) 09/17/2022   Influenza Split 11/01/2018, 09/16/2021   Influenza,inj,Quad PF,6+ Mos 10/20/2016, 11/01/2018, 10/04/2020   Influenza,inj,quad, With Preservative 09/18/2015   PFIZER(Purple Top)SARS-COV-2 Vaccination 03/04/2020, 03/27/2020, 10/04/2020, 04/17/2021, 09/16/2021   PNEUMOCOCCAL CONJUGATE-20 08/01/2021   Tdap 09/18/2015   Zoster Recombinant(Shingrix) 08/28/2021, 10/29/2021   Zoster, Live 08/28/2021, 10/29/2021   Past Medical History:  Diagnosis Date   Allergy    Arthritis 2014   Chicken pox    Colon polyps    History of cystocele 10/30/2020   Hyperlipidemia, unspecified    Hypertension 2012   Migraine 10/30/2020   Ovarian cyst    Squamous cell skin cancer, face  Allergies  Allergen Reactions   Penicillins Hives   Past Surgical History:  Procedure Laterality Date   OOPHORECTOMY Right 2004   large cyst of ovary-benign   Family History  Problem Relation Age of Onset   Hyperlipidemia Mother    Arthritis Mother    Heart disease Mother    Hypertension Mother    Vision loss Mother    Varicose Veins Mother    Hyperlipidemia Father    Arthritis Father    Heart disease Father    Hypertension Father    Heart attack Father    Healthy Daughter        [place], [place] and [place]   Colon cancer Maternal Aunt    Hyperlipidemia Maternal Grandmother    Hypertension Maternal  Grandmother    Heart attack Maternal Grandmother    Heart disease Maternal Grandmother    Colon cancer Maternal Grandfather    Hyperlipidemia Paternal Grandmother    Hypertension Paternal Grandmother    Heart attack Paternal Grandmother    Heart disease Paternal Grandmother    Hypertension Paternal Grandfather    Hyperlipidemia Paternal Grandfather    Heart attack Paternal Grandfather    Heart disease Paternal Grandfather    Arthritis Brother    Hyperlipidemia Brother    Hypertension Brother    Drug abuse Son    BRCA 1/2 Neg Hx    Breast cancer Neg Hx    Social History   Social History Narrative   Marital status/children/pets: Married   Education/employment: B.A., employed in  Engineer, manufacturing:      -Wears a bicycle helmet riding a bike: Yes     -smoke alarm in the home:Yes     - wears seatbelt: Yes     - Feels safe in their relationships: Yes       Allergies as of 12/04/2023       Reactions   Penicillins Hives        Medication List        Accurate as of December 04, 2023 11:03 AM. If you have any questions, ask your nurse or doctor.          calcium carbonate 1500 (600 Ca) MG Tabs tablet Commonly known as: OSCAL 1 tablet with meals   estradiol 0.1 MG/GM vaginal cream Commonly known as: ESTRACE 2x weekly   loratadine 10 MG tablet Commonly known as: CLARITIN 1 tablet   losartan 25 MG tablet Commonly known as: COZAAR Take 0.5 tablets (12.5 mg total) by mouth daily.   magnesium citrate Soln Take 296 mLs by mouth once.   meloxicam 7.5 MG tablet Commonly known as: MOBIC Take 7.5 mg by mouth 2 (two) times daily as needed.       All past medical history, surgical history, allergies, family history, immunizations andmedications were updated in the EMR today and reviewed under the history and medication portions of their EMR.     No results found for this or any previous visit (from the past 2160 hour(s)).  MM 3D SCREEN BREAST  BILATERAL Result Date: 11/05/2022 RECOMMENDATION: Screening mammogram in one year. (Code:SM-B-01Y) BI-RADS CATEGORY  1: Negative. Electronically Signed   By: Bary Richard M.D.   On: 11/05/2022 15:29    ROS 14 pt review of systems performed and negative (unless mentioned in an HPI)  Objective: BP 128/72   Pulse 66   Temp 97.8 F (36.6 C)   Wt 150 lb 6.4 oz (68.2 kg)   SpO2 99%   BMI  25.36 kg/m  Physical Exam Vitals and nursing note reviewed.  Constitutional:      General: She is not in acute distress.    Appearance: Normal appearance. She is not ill-appearing, toxic-appearing or diaphoretic.  HENT:     Head: Normocephalic and atraumatic.  Eyes:     General: No scleral icterus.       Right eye: No discharge.        Left eye: No discharge.     Extraocular Movements: Extraocular movements intact.     Conjunctiva/sclera: Conjunctivae normal.     Pupils: Pupils are equal, round, and reactive to light.  Cardiovascular:     Rate and Rhythm: Normal rate and regular rhythm.  Pulmonary:     Effort: Pulmonary effort is normal. No respiratory distress.     Breath sounds: Normal breath sounds. No wheezing, rhonchi or rales.  Musculoskeletal:     Right lower leg: No edema.     Left lower leg: No edema.  Skin:    General: Skin is warm.     Findings: No rash.  Neurological:     Mental Status: She is alert and oriented to person, place, and time. Mental status is at baseline.     Motor: No weakness.     Gait: Gait normal.  Psychiatric:        Mood and Affect: Mood normal.        Behavior: Behavior normal.        Thought Content: Thought content normal.        Judgment: Judgment normal.    No results found.  Assessment/plan: Sarah Frost is a 67 y.o. female present for condition management HTN/HLD Stable Continue losartan 12.5 mg daily Cardiac CT: 10/29/2022- ZERO, consider rpt 5 years Follow-up in 5.5 months  Osteopenia, unspecified location Continue vitamin D and  calcium supplement Continue routine exercise Limit caffeine and alcohol Vitamin D levels 39.77 09/17/2022 DEXA up-to-date, next DEXA due 2025/2026  Decreased estrogen level Stable Continue estradiol 0.1- 2 times weekly if desired  Hyponatremia: Bmp collected today  Return in about 24 weeks (around 05/20/2024) for Routine chronic condition follow-up.  Orders Placed This Encounter  Procedures   Basic Metabolic Panel (BMET)   Meds ordered this encounter  Medications   losartan (COZAAR) 25 MG tablet    Sig: Take 0.5 tablets (12.5 mg total) by mouth daily.    Dispense:  45 tablet    Refill:  1   Referral Orders  No referral(s) requested today      Note is dictated utilizing voice recognition software. Although note has been proof read prior to signing, occasional typographical errors still can be missed. If any questions arise, please do not hesitate to call for verification.  Electronically signed by: Felix Pacini, DO [facility] [place]

## 2023-12-25 DIAGNOSIS — M9905 Segmental and somatic dysfunction of pelvic region: Secondary | ICD-10-CM | POA: Diagnosis not present

## 2023-12-25 DIAGNOSIS — M9906 Segmental and somatic dysfunction of lower extremity: Secondary | ICD-10-CM | POA: Diagnosis not present

## 2023-12-25 DIAGNOSIS — M7632 Iliotibial band syndrome, left leg: Secondary | ICD-10-CM | POA: Diagnosis not present

## 2023-12-25 DIAGNOSIS — M9904 Segmental and somatic dysfunction of sacral region: Secondary | ICD-10-CM | POA: Diagnosis not present

## 2023-12-25 DIAGNOSIS — M79652 Pain in left thigh: Secondary | ICD-10-CM | POA: Diagnosis not present

## 2023-12-25 DIAGNOSIS — M25552 Pain in left hip: Secondary | ICD-10-CM | POA: Diagnosis not present

## 2023-12-25 DIAGNOSIS — M25562 Pain in left knee: Secondary | ICD-10-CM | POA: Diagnosis not present

## 2023-12-25 DIAGNOSIS — R2689 Other abnormalities of gait and mobility: Secondary | ICD-10-CM | POA: Diagnosis not present

## 2023-12-31 DIAGNOSIS — Z08 Encounter for follow-up examination after completed treatment for malignant neoplasm: Secondary | ICD-10-CM | POA: Diagnosis not present

## 2023-12-31 DIAGNOSIS — Z09 Encounter for follow-up examination after completed treatment for conditions other than malignant neoplasm: Secondary | ICD-10-CM | POA: Diagnosis not present

## 2023-12-31 DIAGNOSIS — L820 Inflamed seborrheic keratosis: Secondary | ICD-10-CM | POA: Diagnosis not present

## 2023-12-31 DIAGNOSIS — L718 Other rosacea: Secondary | ICD-10-CM | POA: Diagnosis not present

## 2023-12-31 DIAGNOSIS — R208 Other disturbances of skin sensation: Secondary | ICD-10-CM | POA: Diagnosis not present

## 2023-12-31 DIAGNOSIS — D225 Melanocytic nevi of trunk: Secondary | ICD-10-CM | POA: Diagnosis not present

## 2023-12-31 DIAGNOSIS — L814 Other melanin hyperpigmentation: Secondary | ICD-10-CM | POA: Diagnosis not present

## 2023-12-31 DIAGNOSIS — Z872 Personal history of diseases of the skin and subcutaneous tissue: Secondary | ICD-10-CM | POA: Diagnosis not present

## 2023-12-31 DIAGNOSIS — L538 Other specified erythematous conditions: Secondary | ICD-10-CM | POA: Diagnosis not present

## 2023-12-31 DIAGNOSIS — Z85828 Personal history of other malignant neoplasm of skin: Secondary | ICD-10-CM | POA: Diagnosis not present

## 2023-12-31 DIAGNOSIS — L821 Other seborrheic keratosis: Secondary | ICD-10-CM | POA: Diagnosis not present

## 2024-02-02 DIAGNOSIS — M7632 Iliotibial band syndrome, left leg: Secondary | ICD-10-CM | POA: Diagnosis not present

## 2024-02-02 DIAGNOSIS — M9904 Segmental and somatic dysfunction of sacral region: Secondary | ICD-10-CM | POA: Diagnosis not present

## 2024-02-02 DIAGNOSIS — M25552 Pain in left hip: Secondary | ICD-10-CM | POA: Diagnosis not present

## 2024-02-02 DIAGNOSIS — M9905 Segmental and somatic dysfunction of pelvic region: Secondary | ICD-10-CM | POA: Diagnosis not present

## 2024-02-02 DIAGNOSIS — M5451 Vertebrogenic low back pain: Secondary | ICD-10-CM | POA: Diagnosis not present

## 2024-02-02 DIAGNOSIS — M9906 Segmental and somatic dysfunction of lower extremity: Secondary | ICD-10-CM | POA: Diagnosis not present

## 2024-02-04 ENCOUNTER — Encounter: Payer: Self-pay | Admitting: Family Medicine

## 2024-02-05 ENCOUNTER — Other Ambulatory Visit: Payer: Self-pay

## 2024-02-05 MED ORDER — LOSARTAN POTASSIUM 25 MG PO TABS: 12.5000 mg | ORAL_TABLET | Freq: Every day | ORAL | 1 refills | Status: DC

## 2024-02-29 ENCOUNTER — Other Ambulatory Visit: Payer: Self-pay | Admitting: Sports Medicine

## 2024-02-29 DIAGNOSIS — M1990 Unspecified osteoarthritis, unspecified site: Secondary | ICD-10-CM

## 2024-02-29 DIAGNOSIS — M1612 Unilateral primary osteoarthritis, left hip: Secondary | ICD-10-CM | POA: Diagnosis not present

## 2024-02-29 DIAGNOSIS — M25552 Pain in left hip: Secondary | ICD-10-CM

## 2024-02-29 DIAGNOSIS — M7989 Other specified soft tissue disorders: Secondary | ICD-10-CM | POA: Diagnosis not present

## 2024-02-29 DIAGNOSIS — R531 Weakness: Secondary | ICD-10-CM | POA: Diagnosis not present

## 2024-02-29 DIAGNOSIS — R2689 Other abnormalities of gait and mobility: Secondary | ICD-10-CM | POA: Diagnosis not present

## 2024-03-03 ENCOUNTER — Ambulatory Visit
Admission: RE | Admit: 2024-03-03 | Discharge: 2024-03-03 | Disposition: A | Discharge status: Home / Self Care | Source: Ambulatory Visit | Attending: Sports Medicine | Admitting: Sports Medicine

## 2024-03-03 DIAGNOSIS — M25552 Pain in left hip: Secondary | ICD-10-CM

## 2024-03-03 DIAGNOSIS — M1990 Unspecified osteoarthritis, unspecified site: Secondary | ICD-10-CM

## 2024-03-09 DIAGNOSIS — I872 Venous insufficiency (chronic) (peripheral): Secondary | ICD-10-CM | POA: Diagnosis not present

## 2024-03-09 DIAGNOSIS — I87393 Chronic venous hypertension (idiopathic) with other complications of bilateral lower extremity: Secondary | ICD-10-CM | POA: Diagnosis not present

## 2024-03-09 DIAGNOSIS — M7989 Other specified soft tissue disorders: Secondary | ICD-10-CM | POA: Diagnosis not present

## 2024-03-10 DIAGNOSIS — H2513 Age-related nuclear cataract, bilateral: Secondary | ICD-10-CM | POA: Diagnosis not present

## 2024-03-10 DIAGNOSIS — H524 Presbyopia: Secondary | ICD-10-CM | POA: Diagnosis not present

## 2024-03-10 DIAGNOSIS — H43811 Vitreous degeneration, right eye: Secondary | ICD-10-CM | POA: Diagnosis not present

## 2024-03-10 DIAGNOSIS — H5213 Myopia, bilateral: Secondary | ICD-10-CM | POA: Diagnosis not present

## 2024-03-15 DIAGNOSIS — M65262 Calcific tendinitis, left lower leg: Secondary | ICD-10-CM | POA: Diagnosis not present

## 2024-03-15 DIAGNOSIS — M25552 Pain in left hip: Secondary | ICD-10-CM | POA: Diagnosis not present

## 2024-03-15 DIAGNOSIS — M25652 Stiffness of left hip, not elsewhere classified: Secondary | ICD-10-CM | POA: Diagnosis not present

## 2024-03-15 DIAGNOSIS — M7062 Trochanteric bursitis, left hip: Secondary | ICD-10-CM | POA: Diagnosis not present

## 2024-03-20 ENCOUNTER — Encounter: Payer: Self-pay | Admitting: Family Medicine

## 2024-03-30 DIAGNOSIS — M9905 Segmental and somatic dysfunction of pelvic region: Secondary | ICD-10-CM | POA: Diagnosis not present

## 2024-03-30 DIAGNOSIS — M25652 Stiffness of left hip, not elsewhere classified: Secondary | ICD-10-CM | POA: Diagnosis not present

## 2024-03-30 DIAGNOSIS — M9906 Segmental and somatic dysfunction of lower extremity: Secondary | ICD-10-CM | POA: Diagnosis not present

## 2024-03-30 DIAGNOSIS — M62831 Muscle spasm of calf: Secondary | ICD-10-CM | POA: Diagnosis not present

## 2024-03-30 DIAGNOSIS — M9904 Segmental and somatic dysfunction of sacral region: Secondary | ICD-10-CM | POA: Diagnosis not present

## 2024-03-30 DIAGNOSIS — M791 Myalgia, unspecified site: Secondary | ICD-10-CM | POA: Diagnosis not present

## 2024-03-30 DIAGNOSIS — M25562 Pain in left knee: Secondary | ICD-10-CM | POA: Diagnosis not present

## 2024-03-30 DIAGNOSIS — M9903 Segmental and somatic dysfunction of lumbar region: Secondary | ICD-10-CM | POA: Diagnosis not present

## 2024-03-30 DIAGNOSIS — M25552 Pain in left hip: Secondary | ICD-10-CM | POA: Diagnosis not present

## 2024-04-11 DIAGNOSIS — N819 Female genital prolapse, unspecified: Secondary | ICD-10-CM | POA: Diagnosis not present

## 2024-04-11 DIAGNOSIS — M25552 Pain in left hip: Secondary | ICD-10-CM | POA: Diagnosis not present

## 2024-04-11 DIAGNOSIS — M6281 Muscle weakness (generalized): Secondary | ICD-10-CM | POA: Diagnosis not present

## 2024-04-15 DIAGNOSIS — M6281 Muscle weakness (generalized): Secondary | ICD-10-CM | POA: Diagnosis not present

## 2024-04-15 DIAGNOSIS — M25552 Pain in left hip: Secondary | ICD-10-CM | POA: Diagnosis not present

## 2024-04-15 DIAGNOSIS — N819 Female genital prolapse, unspecified: Secondary | ICD-10-CM | POA: Diagnosis not present

## 2024-04-19 DIAGNOSIS — M25652 Stiffness of left hip, not elsewhere classified: Secondary | ICD-10-CM | POA: Diagnosis not present

## 2024-04-19 DIAGNOSIS — M9905 Segmental and somatic dysfunction of pelvic region: Secondary | ICD-10-CM | POA: Diagnosis not present

## 2024-04-19 DIAGNOSIS — M1612 Unilateral primary osteoarthritis, left hip: Secondary | ICD-10-CM | POA: Diagnosis not present

## 2024-04-19 DIAGNOSIS — M79652 Pain in left thigh: Secondary | ICD-10-CM | POA: Diagnosis not present

## 2024-04-19 DIAGNOSIS — M62831 Muscle spasm of calf: Secondary | ICD-10-CM | POA: Diagnosis not present

## 2024-04-19 DIAGNOSIS — M9904 Segmental and somatic dysfunction of sacral region: Secondary | ICD-10-CM | POA: Diagnosis not present

## 2024-04-19 DIAGNOSIS — M25552 Pain in left hip: Secondary | ICD-10-CM | POA: Diagnosis not present

## 2024-04-19 DIAGNOSIS — M9906 Segmental and somatic dysfunction of lower extremity: Secondary | ICD-10-CM | POA: Diagnosis not present

## 2024-04-19 DIAGNOSIS — M9903 Segmental and somatic dysfunction of lumbar region: Secondary | ICD-10-CM | POA: Diagnosis not present

## 2024-04-22 DIAGNOSIS — N819 Female genital prolapse, unspecified: Secondary | ICD-10-CM | POA: Diagnosis not present

## 2024-04-22 DIAGNOSIS — M25552 Pain in left hip: Secondary | ICD-10-CM | POA: Diagnosis not present

## 2024-04-22 DIAGNOSIS — M6281 Muscle weakness (generalized): Secondary | ICD-10-CM | POA: Diagnosis not present

## 2024-04-29 DIAGNOSIS — N819 Female genital prolapse, unspecified: Secondary | ICD-10-CM | POA: Diagnosis not present

## 2024-04-29 DIAGNOSIS — M6281 Muscle weakness (generalized): Secondary | ICD-10-CM | POA: Diagnosis not present

## 2024-04-29 DIAGNOSIS — M25552 Pain in left hip: Secondary | ICD-10-CM | POA: Diagnosis not present

## 2024-05-02 DIAGNOSIS — N819 Female genital prolapse, unspecified: Secondary | ICD-10-CM | POA: Diagnosis not present

## 2024-05-02 DIAGNOSIS — M6281 Muscle weakness (generalized): Secondary | ICD-10-CM | POA: Diagnosis not present

## 2024-05-02 DIAGNOSIS — M25552 Pain in left hip: Secondary | ICD-10-CM | POA: Diagnosis not present

## 2024-05-06 DIAGNOSIS — N819 Female genital prolapse, unspecified: Secondary | ICD-10-CM | POA: Diagnosis not present

## 2024-05-06 DIAGNOSIS — M6281 Muscle weakness (generalized): Secondary | ICD-10-CM | POA: Diagnosis not present

## 2024-05-06 DIAGNOSIS — M25552 Pain in left hip: Secondary | ICD-10-CM | POA: Diagnosis not present

## 2024-05-09 DIAGNOSIS — M25552 Pain in left hip: Secondary | ICD-10-CM | POA: Diagnosis not present

## 2024-05-09 DIAGNOSIS — M6281 Muscle weakness (generalized): Secondary | ICD-10-CM | POA: Diagnosis not present

## 2024-05-09 DIAGNOSIS — N819 Female genital prolapse, unspecified: Secondary | ICD-10-CM | POA: Diagnosis not present

## 2024-05-10 DIAGNOSIS — H1132 Conjunctival hemorrhage, left eye: Secondary | ICD-10-CM | POA: Diagnosis not present

## 2024-05-10 DIAGNOSIS — M1612 Unilateral primary osteoarthritis, left hip: Secondary | ICD-10-CM | POA: Diagnosis not present

## 2024-05-10 DIAGNOSIS — M5451 Vertebrogenic low back pain: Secondary | ICD-10-CM | POA: Diagnosis not present

## 2024-05-10 DIAGNOSIS — M25552 Pain in left hip: Secondary | ICD-10-CM | POA: Diagnosis not present

## 2024-05-11 DIAGNOSIS — N819 Female genital prolapse, unspecified: Secondary | ICD-10-CM | POA: Diagnosis not present

## 2024-05-11 DIAGNOSIS — M6281 Muscle weakness (generalized): Secondary | ICD-10-CM | POA: Diagnosis not present

## 2024-05-11 DIAGNOSIS — M25552 Pain in left hip: Secondary | ICD-10-CM | POA: Diagnosis not present

## 2024-05-16 DIAGNOSIS — N819 Female genital prolapse, unspecified: Secondary | ICD-10-CM | POA: Diagnosis not present

## 2024-05-16 DIAGNOSIS — M6281 Muscle weakness (generalized): Secondary | ICD-10-CM | POA: Diagnosis not present

## 2024-05-16 DIAGNOSIS — M25552 Pain in left hip: Secondary | ICD-10-CM | POA: Diagnosis not present

## 2024-05-20 ENCOUNTER — Ambulatory Visit (INDEPENDENT_AMBULATORY_CARE_PROVIDER_SITE_OTHER): Payer: Medicare Other | Admitting: Family Medicine

## 2024-05-20 ENCOUNTER — Encounter: Payer: Self-pay | Admitting: Family Medicine

## 2024-05-20 VITALS — BP 128/82 | HR 68 | Temp 97.9°F | Wt 150.0 lb

## 2024-05-20 DIAGNOSIS — I10 Essential (primary) hypertension: Secondary | ICD-10-CM

## 2024-05-20 DIAGNOSIS — M8589 Other specified disorders of bone density and structure, multiple sites: Secondary | ICD-10-CM | POA: Diagnosis not present

## 2024-05-20 DIAGNOSIS — M859 Disorder of bone density and structure, unspecified: Secondary | ICD-10-CM | POA: Insufficient documentation

## 2024-05-20 DIAGNOSIS — M25552 Pain in left hip: Secondary | ICD-10-CM | POA: Diagnosis not present

## 2024-05-20 DIAGNOSIS — E782 Mixed hyperlipidemia: Secondary | ICD-10-CM | POA: Diagnosis not present

## 2024-05-20 DIAGNOSIS — Z8249 Family history of ischemic heart disease and other diseases of the circulatory system: Secondary | ICD-10-CM | POA: Diagnosis not present

## 2024-05-20 DIAGNOSIS — E663 Overweight: Secondary | ICD-10-CM | POA: Diagnosis not present

## 2024-05-20 DIAGNOSIS — E2839 Other primary ovarian failure: Secondary | ICD-10-CM | POA: Diagnosis not present

## 2024-05-20 DIAGNOSIS — M6281 Muscle weakness (generalized): Secondary | ICD-10-CM | POA: Diagnosis not present

## 2024-05-20 DIAGNOSIS — N819 Female genital prolapse, unspecified: Secondary | ICD-10-CM | POA: Diagnosis not present

## 2024-05-20 LAB — LIPID PANEL
Cholesterol: 244 mg/dL — ABNORMAL HIGH (ref 0–200)
HDL: 90.4 mg/dL (ref >39.00)
LDL Cholesterol: 144 mg/dL — ABNORMAL HIGH (ref 0–99)
NonHDL: 153.85
Total CHOL/HDL Ratio: 3
Triglycerides: 47 mg/dL (ref 0.0–149.0)
VLDL: 9.4 mg/dL (ref 0.0–40.0)

## 2024-05-20 LAB — COMPREHENSIVE METABOLIC PANEL WITH GFR
ALT: 16 U/L (ref 0–35)
AST: 20 U/L (ref 0–37)
Albumin: 4.3 g/dL (ref 3.5–5.2)
Alkaline Phosphatase: 64 U/L (ref 39–117)
BUN: 14 mg/dL (ref 6–23)
CO2: 30 meq/L (ref 19–32)
Calcium: 9.4 mg/dL (ref 8.4–10.5)
Chloride: 101 meq/L (ref 96–112)
Creatinine, Ser: 0.62 mg/dL (ref 0.40–1.20)
GFR: 91.56 mL/min (ref >60.00)
Glucose, Bld: 80 mg/dL (ref 70–99)
Potassium: 4.1 meq/L (ref 3.5–5.1)
Sodium: 138 meq/L (ref 135–145)
Total Bilirubin: 0.6 mg/dL (ref 0.2–1.2)
Total Protein: 6.8 g/dL (ref 6.0–8.3)

## 2024-05-20 LAB — CBC
HCT: 37.9 % (ref 36.0–46.0)
Hemoglobin: 12.5 g/dL (ref 12.0–15.0)
MCHC: 33 g/dL (ref 30.0–36.0)
MCV: 95 fl (ref 78.0–100.0)
Platelets: 254 10*3/uL (ref 150.0–400.0)
RBC: 3.98 Mil/uL (ref 3.87–5.11)
RDW: 14.8 % (ref 11.5–15.5)
WBC: 6.4 10*3/uL (ref 4.0–10.5)

## 2024-05-20 LAB — TSH: TSH: 1.58 u[IU]/mL (ref 0.35–5.50)

## 2024-05-20 LAB — VITAMIN D 25 HYDROXY (VIT D DEFICIENCY, FRACTURES): VITD: 21.79 ng/mL — ABNORMAL LOW (ref 30.00–100.00)

## 2024-05-20 MED ORDER — LOSARTAN POTASSIUM 25 MG PO TABS: 12.5000 mg | ORAL_TABLET | Freq: Every day | ORAL | 1 refills | Status: DC

## 2024-05-20 MED ORDER — ESTRADIOL 0.1 MG/GM VA CREA: TOPICAL_CREAM | VAGINAL | 11 refills | Status: AC

## 2024-05-20 NOTE — Patient Instructions (Signed)

## 2024-05-20 NOTE — Progress Notes (Signed)
 Patient ID: Sarah Frost, female  DOB: 05-Feb-1956, 68 y.o.   MRN: [id number] Patient Care Team    Relationship Specialty Notifications Start End  Mariel Shope, DO PCP - General Family Medicine  04/09/22   [facility] Consulting Physician Dermatology  04/09/22   Concepcion Deck, MD Consulting Physician Obstetrics and Gynecology  04/09/22   Evalene Hilda, OD  Ophthalmology  04/09/22   Genell Ken, MD Consulting Physician Gastroenterology  04/09/22   [facility] Consulting Physician Peripheral Vascular Disease  04/09/22   Clyde Darling, DO Referring Physician Sports Medicine  04/09/22     Chief Complaint  Patient presents with   Hypertension    Pt is fasting.     Subjective:  Sarah Frost is a 68 y.o. female present for Chronic Conditions/illness Management All past medical history, surgical history, allergies, family history, immunizations, medications and social history were updated in the electronic medical record today. All recent labs, ED visits and hospitalizations within the last year were reviewed.  HTN/HLD: Pt reports compliance with losartan  12.5 mg daily.   Patient denies chest pain, shortness of breath, dizziness or lower extremity edema.  Patient had been on Maxide in the past and was having continued electrolyte disturbances which required change in therapy. Pt does not take a daily baby ASA. Pt is not prescribed statin. Diet: Low-sodium RF:fh heart disease, HTN, HLD  Osteopenia: T score -1.3 completed January 2023.  She does supplement with calcium and vitamin D .  She exercises routinely.  Estrogen deficiency: Patient reports she would like to continue her Estrace  0.1 mg/GM vaginal cream twice weekly.  No negative side effects up-to-date with mammograms     05/20/2024   10:10 AM 08/19/2023    1:04 PM 06/16/2023    9:58 AM 08/06/2022   11:35 AM 04/09/2022    8:46 AM  Depression screen PHQ 2/9  Decreased Interest 0 0 0 0 0  Down,  Depressed, Hopeless 0 0 0 0 0  PHQ - 2 Score 0 0 0 0 0  Altered sleeping 0      Tired, decreased energy 0      Change in appetite 0      Feeling bad or failure about yourself  0      Trouble concentrating 0      Moving slowly or fidgety/restless 0      Suicidal thoughts 0      PHQ-9 Score 0      Difficult doing work/chores Not difficult at all          05/20/2024   10:10 AM  GAD 7 : Generalized Anxiety Score  Nervous, Anxious, on Edge 0  Control/stop worrying 0  Worry too much - different things 0  Trouble relaxing 0  Restless 0  Easily annoyed or irritable 0  Afraid - awful might happen 0  Total GAD 7 Score 0  Anxiety Difficulty Not difficult at all          05/20/2024   10:10 AM 11/30/2023   12:41 PM 08/16/2023   10:17 AM 06/16/2023    9:58 AM 09/07/2022   11:41 AM  Fall Risk   Falls in the past year? 0 0 0 0 0  Number falls in past yr:   0 0   Injury with Fall?   0 0   Risk for fall due to :   Impaired vision    Follow up Falls evaluation completed  Falls prevention  discussed      Immunization History  Administered Date(s) Administered   Fluad Quad(high Dose 65+) 09/17/2022   Influenza Split 11/01/2018, 09/16/2021   Influenza,inj,Quad PF,6+ Mos 10/20/2016, 11/01/2018, 10/04/2020   Influenza,inj,quad, With Preservative 09/18/2015   Influenza-Unspecified 09/18/2023   PFIZER(Purple Top)SARS-COV-2 Vaccination 03/04/2020, 03/27/2020, 10/04/2020, 04/17/2021, 09/16/2021   PNEUMOCOCCAL CONJUGATE-20 08/01/2021   Tdap 09/18/2015   Zoster Recombinant(Shingrix) 08/28/2021, 10/29/2021   Zoster, Live 08/28/2021, 10/29/2021   Past Medical History:  Diagnosis Date   Allergy    Arthritis 2014   Chicken pox    Colon polyps    History of cystocele 10/30/2020   Hyperlipidemia, unspecified    Hypertension 2012   Migraine 10/30/2020   Ovarian cyst    Squamous cell skin cancer, face    Allergies  Allergen Reactions   Penicillins Hives   Past Surgical History:  Procedure  Laterality Date   OOPHORECTOMY Right 2004   large cyst of ovary-benign   Family History  Problem Relation Age of Onset   Hyperlipidemia Mother    Arthritis Mother    Heart disease Mother    Hypertension Mother    Vision loss Mother    Varicose Veins Mother    Hyperlipidemia Father    Arthritis Father    Heart disease Father    Hypertension Father    Heart attack Father    Healthy Daughter        [place]   Colon cancer Maternal Aunt    Hyperlipidemia Maternal Grandmother    Hypertension Maternal Grandmother    Heart attack Maternal Grandmother    Heart disease Maternal Grandmother    Colon cancer Maternal Grandfather    Hyperlipidemia Paternal Grandmother    Hypertension Paternal Grandmother    Heart attack Paternal Grandmother    Heart disease Paternal Grandmother    Hypertension Paternal Grandfather    Hyperlipidemia Paternal Grandfather    Heart attack Paternal Grandfather    Heart disease Paternal Grandfather    Arthritis Brother    Hyperlipidemia Brother    Hypertension Brother    Drug abuse Son    BRCA 1/2 Neg Hx    Breast cancer Neg Hx    Social History   Social History Narrative   Marital status/children/pets: Married   Education/employment: B.A., employed in  Engineer, manufacturing:      -Wears a bicycle helmet riding a bike: Yes     -smoke alarm in the home:Yes     - wears seatbelt: Yes     - Feels safe in their relationships: Yes       Allergies as of 05/20/2024       Reactions   Penicillins Hives        Medication List        Accurate as of May 20, 2024 10:33 AM. If you have any questions, ask your nurse or doctor.          STOP taking these medications    meloxicam 7.5 MG tablet Commonly known as: MOBIC Stopped by: Napolean Backbone       TAKE these medications    calcium carbonate 1500 (600 Ca) MG Tabs tablet Commonly known as: OSCAL 1 tablet with meals   estradiol  0.1 MG/GM vaginal cream Commonly  known as: ESTRACE  2x weekly   gabapentin 300 MG capsule Commonly known as: NEURONTIN SMARTSIG:1 Capsule(s) By Mouth 1-3 Times Daily PRN   loratadine 10 MG tablet Commonly known as: CLARITIN 1 tablet   losartan   25 MG tablet Commonly known as: COZAAR  Take 0.5 tablets (12.5 mg total) by mouth daily.   magnesium citrate Soln Take 296 mLs by mouth once.       All past medical history, surgical history, allergies, family history, immunizations andmedications were updated in the EMR today and reviewed under the history and medication portions of their EMR.     No results found for this or any previous visit (from the past 2160 hours).  MM 3D SCREEN BREAST BILATERAL Result Date: 11/05/2022 RECOMMENDATION: Screening mammogram in one year. (Code:[id number]-B-01Y) BI-RADS CATEGORY  1: Negative. Electronically Signed   By: Peggie Bowen M.D.   On: 11/05/2022 15:29    ROS 14 pt review of systems performed and negative (unless mentioned in an HPI)  Objective: BP 128/82   Pulse 68   Temp 97.9 F (36.6 C)   Wt 150 lb (68 kg)   SpO2 97%   BMI 25.30 kg/m  Physical Exam Vitals and nursing note reviewed.  Constitutional:      General: She is not in acute distress.    Appearance: Normal appearance. She is not ill-appearing, toxic-appearing or diaphoretic.  HENT:     Head: Normocephalic and atraumatic.  Eyes:     General: No scleral icterus.       Right eye: No discharge.        Left eye: No discharge.     Extraocular Movements: Extraocular movements intact.     Conjunctiva/sclera: Conjunctivae normal.     Pupils: Pupils are equal, round, and reactive to light.  Cardiovascular:     Rate and Rhythm: Normal rate and regular rhythm.  Pulmonary:     Effort: Pulmonary effort is normal. No respiratory distress.     Breath sounds: Normal breath sounds. No wheezing, rhonchi or rales.  Musculoskeletal:     Cervical back: Neck supple.     Right lower leg: No edema.     Left lower leg: No  edema.  Skin:    General: Skin is warm.     Findings: No rash.  Neurological:     Mental Status: She is alert and oriented to person, place, and time. Mental status is at baseline.     Motor: No weakness.     Gait: Gait normal.  Psychiatric:        Mood and Affect: Mood normal.        Behavior: Behavior normal.        Thought Content: Thought content normal.        Judgment: Judgment normal.    No results found.  Assessment/plan: Sarah Frost is a 68 y.o. female present for condition management HTN/HLD Stable Continue losartan  12.5 mg daily Cardiac CT: 10/29/2022- ZERO, consider rpt 5 years Cbc, cmp, tsh, lipid collected today  Osteopenia, unspecified location Continue vitamin D  and calcium supplement Continue routine exercise Limit caffeine and alcohol Vitamin D  levels 39.77 09/17/2022> collected today DEXA up-to-date, next DEXA due 2025/2026  Decreased estrogen level Stable Continue estradiol  0.1- 2 times weekly if desired   Return in about 24 weeks (around 11/04/2024) for Routine chronic condition follow-up.  Orders Placed This Encounter  Procedures   CBC   Comp Met (CMET)   TSH   Lipid panel   Vitamin D  (25 hydroxy)   Meds ordered this encounter  Medications   losartan  (COZAAR ) 25 MG tablet    Sig: Take 0.5 tablets (12.5 mg total) by mouth daily.    Dispense:  45 tablet  Refill:  1   estradiol  (ESTRACE ) 0.1 MG/GM vaginal cream    Sig: 2x weekly    Dispense:  42.5 g    Refill:  11   Referral Orders  No referral(s) requested today      Note is dictated utilizing voice recognition software. Although note has been proof read prior to signing, occasional typographical errors still can be missed. If any questions arise, please do not hesitate to call for verification.  Electronically signed by: Napolean Backbone, DO [facility] [place]

## 2024-05-24 ENCOUNTER — Ambulatory Visit: Payer: Self-pay | Admitting: Family Medicine

## 2024-05-24 DIAGNOSIS — M6281 Muscle weakness (generalized): Secondary | ICD-10-CM | POA: Diagnosis not present

## 2024-05-24 DIAGNOSIS — N819 Female genital prolapse, unspecified: Secondary | ICD-10-CM | POA: Diagnosis not present

## 2024-05-24 DIAGNOSIS — M25552 Pain in left hip: Secondary | ICD-10-CM | POA: Diagnosis not present

## 2024-05-26 DIAGNOSIS — M25562 Pain in left knee: Secondary | ICD-10-CM | POA: Diagnosis not present

## 2024-05-26 DIAGNOSIS — M25552 Pain in left hip: Secondary | ICD-10-CM | POA: Diagnosis not present

## 2024-05-26 DIAGNOSIS — M79652 Pain in left thigh: Secondary | ICD-10-CM | POA: Diagnosis not present

## 2024-05-26 DIAGNOSIS — M9906 Segmental and somatic dysfunction of lower extremity: Secondary | ICD-10-CM | POA: Diagnosis not present

## 2024-05-26 NOTE — Telephone Encounter (Signed)
 No further action needed at this time.

## 2024-05-27 DIAGNOSIS — M25552 Pain in left hip: Secondary | ICD-10-CM | POA: Diagnosis not present

## 2024-05-27 DIAGNOSIS — M6281 Muscle weakness (generalized): Secondary | ICD-10-CM | POA: Diagnosis not present

## 2024-05-27 DIAGNOSIS — N819 Female genital prolapse, unspecified: Secondary | ICD-10-CM | POA: Diagnosis not present

## 2024-05-30 DIAGNOSIS — M6281 Muscle weakness (generalized): Secondary | ICD-10-CM | POA: Diagnosis not present

## 2024-05-30 DIAGNOSIS — M25552 Pain in left hip: Secondary | ICD-10-CM | POA: Diagnosis not present

## 2024-05-30 DIAGNOSIS — N819 Female genital prolapse, unspecified: Secondary | ICD-10-CM | POA: Diagnosis not present

## 2024-06-03 DIAGNOSIS — N819 Female genital prolapse, unspecified: Secondary | ICD-10-CM | POA: Diagnosis not present

## 2024-06-03 DIAGNOSIS — M25552 Pain in left hip: Secondary | ICD-10-CM | POA: Diagnosis not present

## 2024-06-03 DIAGNOSIS — M6281 Muscle weakness (generalized): Secondary | ICD-10-CM | POA: Diagnosis not present

## 2024-06-15 DIAGNOSIS — M9904 Segmental and somatic dysfunction of sacral region: Secondary | ICD-10-CM | POA: Diagnosis not present

## 2024-06-15 DIAGNOSIS — M79605 Pain in left leg: Secondary | ICD-10-CM | POA: Diagnosis not present

## 2024-06-15 DIAGNOSIS — M9906 Segmental and somatic dysfunction of lower extremity: Secondary | ICD-10-CM | POA: Diagnosis not present

## 2024-06-15 DIAGNOSIS — M5442 Lumbago with sciatica, left side: Secondary | ICD-10-CM | POA: Diagnosis not present

## 2024-06-15 DIAGNOSIS — M5459 Other low back pain: Secondary | ICD-10-CM | POA: Diagnosis not present

## 2024-06-15 DIAGNOSIS — M9905 Segmental and somatic dysfunction of pelvic region: Secondary | ICD-10-CM | POA: Diagnosis not present

## 2024-06-27 ENCOUNTER — Other Ambulatory Visit (HOSPITAL_COMMUNITY)

## 2024-06-28 DIAGNOSIS — M47816 Spondylosis without myelopathy or radiculopathy, lumbar region: Secondary | ICD-10-CM | POA: Diagnosis not present

## 2024-06-28 DIAGNOSIS — M5126 Other intervertebral disc displacement, lumbar region: Secondary | ICD-10-CM | POA: Diagnosis not present

## 2024-06-28 DIAGNOSIS — M2578 Osteophyte, vertebrae: Secondary | ICD-10-CM | POA: Diagnosis not present

## 2024-07-11 DIAGNOSIS — M7062 Trochanteric bursitis, left hip: Secondary | ICD-10-CM | POA: Diagnosis not present

## 2024-07-15 DIAGNOSIS — M5451 Vertebrogenic low back pain: Secondary | ICD-10-CM | POA: Diagnosis not present

## 2024-07-15 DIAGNOSIS — M5126 Other intervertebral disc displacement, lumbar region: Secondary | ICD-10-CM | POA: Diagnosis not present

## 2024-07-15 DIAGNOSIS — M25552 Pain in left hip: Secondary | ICD-10-CM | POA: Diagnosis not present

## 2024-07-18 ENCOUNTER — Telehealth: Payer: Self-pay | Admitting: Physical Medicine and Rehabilitation

## 2024-07-18 DIAGNOSIS — M5416 Radiculopathy, lumbar region: Secondary | ICD-10-CM

## 2024-07-18 DIAGNOSIS — M5116 Intervertebral disc disorders with radiculopathy, lumbar region: Secondary | ICD-10-CM

## 2024-07-18 NOTE — Telephone Encounter (Signed)
-----   Message from Rosina DELENA Sours sent at 07/18/2024  8:48 AM EDT ----- ESI consult ----- Message ----- From: Vinita Milling Sent: 07/18/2024   8:39 AM EDT To: Rosina DELENA Sours, RT

## 2024-08-11 ENCOUNTER — Other Ambulatory Visit: Payer: Self-pay

## 2024-08-11 ENCOUNTER — Ambulatory Visit: Admitting: Physical Medicine and Rehabilitation

## 2024-08-11 VITALS — BP 172/83 | HR 64

## 2024-08-11 DIAGNOSIS — M5116 Intervertebral disc disorders with radiculopathy, lumbar region: Secondary | ICD-10-CM

## 2024-08-11 DIAGNOSIS — M5416 Radiculopathy, lumbar region: Secondary | ICD-10-CM | POA: Diagnosis not present

## 2024-08-11 MED ORDER — METHYLPREDNISOLONE ACETATE 40 MG/ML IJ SUSP
40.0000 mg | Freq: Once | INTRAMUSCULAR | Status: AC
  Administered 2024-08-11: 40 mg

## 2024-08-11 NOTE — Progress Notes (Signed)
 Pain Scale   Average Pain 4 Patient advising she has lower back pain radiating to left leg walking make pain worse and sitting decreases pain.. Patient advised to follow up with PCP due to Blood pressure being high today.Patient advised she was very nervous ans that may have caused B/P to be higher that normal.        +Driver, -BT, -Dye Allergies.

## 2024-08-16 DIAGNOSIS — M47816 Spondylosis without myelopathy or radiculopathy, lumbar region: Secondary | ICD-10-CM | POA: Diagnosis not present

## 2024-08-16 DIAGNOSIS — M25552 Pain in left hip: Secondary | ICD-10-CM | POA: Diagnosis not present

## 2024-08-16 DIAGNOSIS — M5442 Lumbago with sciatica, left side: Secondary | ICD-10-CM | POA: Diagnosis not present

## 2024-08-16 DIAGNOSIS — G8929 Other chronic pain: Secondary | ICD-10-CM | POA: Diagnosis not present

## 2024-08-16 DIAGNOSIS — M5126 Other intervertebral disc displacement, lumbar region: Secondary | ICD-10-CM | POA: Diagnosis not present

## 2024-08-21 NOTE — Progress Notes (Signed)
 Sarah Frost - 68 y.o. female MRN [id number]  Date of birth: [birth year]  Office Visit Note: Visit Date: 08/11/2024 PCP: Catherine Charlies LABOR, DO Referred by: Marquette Ozell BIRCH, DO  Subjective: Chief Complaint  Patient presents with   Lower Back - Pain   HPI:  Sarah Frost is a 68 y.o. female who comes in today at the request of Dr. Ozell Marquette for planned Left L5-S1 and S1-2 Lumbar Transforaminal epidural steroid injection with fluoroscopic guidance.  The patient has failed conservative care including home exercise, medications, time and activity modification.  This injection will be diagnostic and hopefully therapeutic.  Please see requesting physician notes for further details and justification.   ROS Otherwise per HPI.  Assessment & Plan: Visit Diagnoses:    ICD-10-CM   1. Lumbar radiculopathy  M54.16 XR C-ARM NO REPORT    Epidural Steroid injection    methylPREDNISolone  acetate (DEPO-MEDROL ) injection 40 mg    2. Radiculopathy due to lumbar intervertebral disc disorder  M51.16       Plan: No additional findings.   Meds & Orders:  Meds ordered this encounter  Medications   methylPREDNISolone  acetate (DEPO-MEDROL ) injection 40 mg    Orders Placed This Encounter  Procedures   XR C-ARM NO REPORT   Epidural Steroid injection    Follow-up: Return for visit to requesting provider as needed.   Procedures: No procedures performed  Lumbosacral Transforaminal Epidural Steroid Injection - Sub-Pedicular Approach with Fluoroscopic Guidance  Patient: Sarah Frost      Date of Birth: 1956/07/25 MRN: [id number] PCP: Catherine Charlies LABOR, DO      Visit Date: 08/11/2024   Universal Protocol:    Date/Time: 08/11/2024  Consent Given By: the patient  Position: PRONE  Additional Comments: Vital signs were monitored before and after the procedure. Patient was prepped and draped in the usual sterile fashion. The correct patient, procedure, and site was verified.   Injection  Procedure Details:   Procedure diagnoses: Lumbar radiculopathy [M54.16]    Meds Administered:  Meds ordered this encounter  Medications   methylPREDNISolone  acetate (DEPO-MEDROL ) injection 40 mg    Laterality: Left  Location/Site: L5 and S1  Needle:5.0 in., 22 ga.  Short bevel or Quincke spinal needle  Needle Placement: Transforaminal  Findings:    -Comments: Excellent flow of contrast along the nerve, nerve root and into the epidural space.  Procedure Details: After squaring off the end-plates to get a true AP view, the C-arm was positioned so that an oblique view of the foramen as noted above was visualized. The target area is just inferior to the nose of the scotty dog or sub pedicular. The soft tissues overlying this structure were infiltrated with 2-3 ml. of 1% Lidocaine without Epinephrine.  The spinal needle was inserted toward the target using a trajectory view along the fluoroscope beam.  Under AP and lateral visualization, the needle was advanced so it did not puncture dura and was located close the 6 O'Clock position of the pedical in AP tracterory. Biplanar projections were used to confirm position. Aspiration was confirmed to be negative for CSF and/or blood. A 1-2 ml. volume of Isovue-250 was injected and flow of contrast was noted at each level. Radiographs were obtained for documentation purposes.   After attaining the desired flow of contrast documented above, a 0.5 to 1.0 ml test dose of 0.25% Marcaine was injected into each respective transforaminal space.  The patient was observed for 90 seconds post injection.  After  no sensory deficits were reported, and normal lower extremity motor function was noted,   the above injectate was administered so that equal amounts of the injectate were placed at each foramen (level) into the transforaminal epidural space.   Additional Comments:  No complications occurred Dressing: 2 x 2 sterile gauze and Band-Aid     Post-procedure details: Patient was observed during the procedure. Post-procedure instructions were reviewed.  Patient left the clinic in stable condition.    Clinical History: 07/08/2024  Formatting of this note might be different from the original.  TECHNIQUE: Multiplanar, multisequence MR imaging of the lumbar spine was obtained without contrast   COMPARISON: None   INDICATION: r/o nerve involvement as source of pain   FINDINGS:   . The conus medullaris appears normal, terminating at the L1 level. No abnormal signal demonstrated within the visualized distal spinal cord. Cauda equina unremarkable.   . Vertebral body heights maintained. Marrow signal within normal limits for age.   . Alignment: Mild levoconvex scoliosis. No spondylolisthesis.  . Modic 1 endplate changes/discogenic marrow edema:L5-S1  . Vertebral numbering:Standard   . The visualized lower thoracic spine demonstrates no disc herniation, significant central canal or foraminal stenosis.   . T12-L1: No disc herniation, significant central canal or foraminal stenosis.  SABRA L1-L2: No disc herniation, significant central canal or foraminal stenosis.  SABRA L2-L3: No disc herniation, significant central canal or foraminal stenosis.  SABRA L3-L4: No disc herniation, significant central canal or foraminal stenosis. Small broad-based disc bulge. Mild facet arthrosis.  SABRA L4-L5: Small posterior annular fissure. Small concentric disc bulge. Mild facet arthrosis and ligamentum flavum thickening.  SABRA L5-S1: Far lateral left foraminal disc osteophyte complex placing mass effect on the left L5 nerve root. Mild left marrow edema. Small left paracentral focal disc protrusion abutting the left S1 nerve root. Moderate asymmetric left-sided disc space narrowing.   . Soft tissues are grossly unremarkable    IMPRESSION:  Far lateral left foraminal disc osteophyte complex at L5-S1 placing mass effect on the left L5 nerve root. Small left  paracentral focal disc protrusion abutting the left S1 nerve root. Additional degenerative changes as detailed in the report.   Electronically Signed by: Prentice Clay, MD on 07/08/2024 8:25 AM     Objective:  VS:  HT:    WT:   BMI:     BP:(!) 172/83  HR:64bpm  TEMP: ( )  RESP:  Physical Exam Vitals and nursing note reviewed.  Constitutional:      General: She is not in acute distress.    Appearance: Normal appearance. She is well-developed. She is not ill-appearing.  HENT:     Head: Normocephalic and atraumatic.     Right Ear: External ear normal.     Left Ear: External ear normal.  Eyes:     Extraocular Movements: Extraocular movements intact.     Conjunctiva/sclera: Conjunctivae normal.     Pupils: Pupils are equal, round, and reactive to light.  Cardiovascular:     Rate and Rhythm: Normal rate.     Pulses: Normal pulses.  Pulmonary:     Effort: Pulmonary effort is normal. No respiratory distress.  Abdominal:     General: There is no distension.     Palpations: Abdomen is soft.  Musculoskeletal:        General: Tenderness present.     Cervical back: Neck supple.     Right lower leg: No edema.     Left lower leg: No edema.  Comments: Patient has good distal strength with no pain over the greater trochanters.  No clonus or focal weakness.  Skin:    General: Skin is warm and dry.     Findings: No erythema, lesion or rash.  Neurological:     General: No focal deficit present.     Mental Status: She is alert and oriented to person, place, and time.     Sensory: No sensory deficit.     Motor: No weakness or abnormal muscle tone.     Coordination: Coordination normal.     Gait: Gait normal.  Psychiatric:        Mood and Affect: Mood normal.        Behavior: Behavior normal.      Imaging: No results found.

## 2024-08-21 NOTE — Procedures (Signed)
 Lumbosacral Transforaminal Epidural Steroid Injection - Sub-Pedicular Approach with Fluoroscopic Guidance  Patient: Sarah Frost      Date of Birth: 23-Mar-1956 MRN: [id number] PCP: Catherine Charlies LABOR, DO      Visit Date: 08/11/2024   Universal Protocol:    Date/Time: 08/11/2024  Consent Given By: the patient  Position: PRONE  Additional Comments: Vital signs were monitored before and after the procedure. Patient was prepped and draped in the usual sterile fashion. The correct patient, procedure, and site was verified.   Injection Procedure Details:   Procedure diagnoses: Lumbar radiculopathy [M54.16]    Meds Administered:  Meds ordered this encounter  Medications   methylPREDNISolone  acetate (DEPO-MEDROL ) injection 40 mg    Laterality: Left  Location/Site: L5 and S1  Needle:5.0 in., 22 ga.  Short bevel or Quincke spinal needle  Needle Placement: Transforaminal  Findings:    -Comments: Excellent flow of contrast along the nerve, nerve root and into the epidural space.  Procedure Details: After squaring off the end-plates to get a true AP view, the C-arm was positioned so that an oblique view of the foramen as noted above was visualized. The target area is just inferior to the nose of the scotty dog or sub pedicular. The soft tissues overlying this structure were infiltrated with 2-3 ml. of 1% Lidocaine without Epinephrine.  The spinal needle was inserted toward the target using a trajectory view along the fluoroscope beam.  Under AP and lateral visualization, the needle was advanced so it did not puncture dura and was located close the 6 O'Clock position of the pedical in AP tracterory. Biplanar projections were used to confirm position. Aspiration was confirmed to be negative for CSF and/or blood. A 1-2 ml. volume of Isovue-250 was injected and flow of contrast was noted at each level. Radiographs were obtained for documentation purposes.   After attaining the  desired flow of contrast documented above, a 0.5 to 1.0 ml test dose of 0.25% Marcaine was injected into each respective transforaminal space.  The patient was observed for 90 seconds post injection.  After no sensory deficits were reported, and normal lower extremity motor function was noted,   the above injectate was administered so that equal amounts of the injectate were placed at each foramen (level) into the transforaminal epidural space.   Additional Comments:  No complications occurred Dressing: 2 x 2 sterile gauze and Band-Aid    Post-procedure details: Patient was observed during the procedure. Post-procedure instructions were reviewed.  Patient left the clinic in stable condition.

## 2024-09-07 ENCOUNTER — Ambulatory Visit: Admitting: Physical Medicine and Rehabilitation

## 2024-09-19 ENCOUNTER — Encounter: Payer: Self-pay | Admitting: Physical Medicine and Rehabilitation

## 2024-09-19 ENCOUNTER — Ambulatory Visit (INDEPENDENT_AMBULATORY_CARE_PROVIDER_SITE_OTHER): Admitting: Physical Medicine and Rehabilitation

## 2024-09-19 DIAGNOSIS — M9903 Segmental and somatic dysfunction of lumbar region: Secondary | ICD-10-CM | POA: Diagnosis not present

## 2024-09-19 DIAGNOSIS — M9905 Segmental and somatic dysfunction of pelvic region: Secondary | ICD-10-CM | POA: Diagnosis not present

## 2024-09-19 DIAGNOSIS — M5116 Intervertebral disc disorders with radiculopathy, lumbar region: Secondary | ICD-10-CM

## 2024-09-19 DIAGNOSIS — M9908 Segmental and somatic dysfunction of rib cage: Secondary | ICD-10-CM | POA: Diagnosis not present

## 2024-09-19 DIAGNOSIS — M47816 Spondylosis without myelopathy or radiculopathy, lumbar region: Secondary | ICD-10-CM | POA: Diagnosis not present

## 2024-09-19 DIAGNOSIS — M25552 Pain in left hip: Secondary | ICD-10-CM | POA: Diagnosis not present

## 2024-09-19 DIAGNOSIS — M9906 Segmental and somatic dysfunction of lower extremity: Secondary | ICD-10-CM | POA: Diagnosis not present

## 2024-09-19 DIAGNOSIS — M9904 Segmental and somatic dysfunction of sacral region: Secondary | ICD-10-CM | POA: Diagnosis not present

## 2024-09-19 DIAGNOSIS — M5416 Radiculopathy, lumbar region: Secondary | ICD-10-CM

## 2024-09-19 NOTE — Progress Notes (Unsigned)
 Sarah Frost - 68 y.o. female MRN [id number]  Date of birth: [birth year]  Office Visit Note: Visit Date: 09/19/2024 PCP: Catherine Charlies LABOR, DO Referred by: Catherine Charlies LABOR, DO  Subjective: Chief Complaint  Patient presents with   Left Hip - Pain, Follow-up   HPI: Sarah Frost is a 68 y.o. female who comes in today for evaluation of chronic, worsening and severe pain to left lateral hip radiating down left lateral region of thigh to knee. No real lower back discomfort. Pain ongoing for 3 plus years. She was initially referred to us  by Dr. Ozell Jewell. Her pain worsens with prolonged sitting. She describes pain as constant aching sensation, currently rates as 8 out of 10. Her pain becomes severe at night time when sleeping. Some relief of pain with home exercise regimen, rest and use of medications. Some relief of pain with short course of Meloxicam. Lumbar MRI imaging [facility]) from July of this year shows far lateral left foraminal disc osteophyte complex at L5-S1 placing mass effect on the left L5 nerve root. Mild left marrow edema. Small left paracentral focal disc protrusion abutting the left S1 nerve root. Moderate asymmetric left-sided disc space narrowing. She recently underwent left L5 and S1 transforaminal epidural steroid injection in our office on 08/11/2024. No relief of pain with this procedure. Patient denies focal weakness, numbness and tingling. No recent trauma or falls.       Review of Systems  Musculoskeletal:  Positive for back pain.  Neurological:  Negative for tingling, sensory change, focal weakness and weakness.  All other systems reviewed and are negative.  Otherwise per HPI.  Assessment & Plan: Visit Diagnoses:    ICD-10-CM   1. Lumbar radiculopathy  M54.16 Ambulatory referral to Physical Medicine Rehab    2. Radiculopathy due to lumbar intervertebral disc disorder  M51.16 Ambulatory referral to Physical Medicine Rehab       Plan: Findings:   Chronic, worsening and severe pain to left lateral hip radiating down left lateral region of thigh to knee. Patient continues to have severe pain despite good conservative therapies such as formal physical therapy, home exercise regimen, rest and use of medications. Patients clinical presentation and exam are consistent with lumbar radiculopathy, more of L5 distribution. There are disc protrusions at L5-S1 impacting both L5 and S1 nerve roots. Recent transforaminal epidural steroid injection on the left at L5 and S1 did not provide relief of pain. We discussed further options such as interlaminar epidural steroid injection and re-grouping with PT. Next step is to perform diagnostic and hopefully therapeutic left L5-S1 interlaminar epidural steroid injection under fluoroscopic guidance. If good relief of pain with injection we can repeat this procedure infrequently as needed. She does follow up with Dr. Jewell today, would consider extraspinal sources of pain such as iliotibial band etiology/greater trochanter bursitis. No red flag symptoms noted upon exam today.     Meds & Orders: No orders of the defined types were placed in this encounter.   Orders Placed This Encounter  Procedures   Ambulatory referral to Physical Medicine Rehab    Follow-up: Return for Left L5-S1 interlaminar epidural steroid injection.   Procedures: No procedures performed      Clinical History: 07/08/2024  Formatting of this note might be different from the original.  TECHNIQUE: Multiplanar, multisequence MR imaging of the lumbar spine was obtained without contrast   COMPARISON: None   INDICATION: r/o nerve involvement as source of pain   FINDINGS:   .  The conus medullaris appears normal, terminating at the L1 level. No abnormal signal demonstrated within the visualized distal spinal cord. Cauda equina unremarkable.   . Vertebral body heights maintained. Marrow signal within normal limits for age.   . Alignment:  Mild levoconvex scoliosis. No spondylolisthesis.  . Modic 1 endplate changes/discogenic marrow edema:L5-S1  . Vertebral numbering:Standard   . The visualized lower thoracic spine demonstrates no disc herniation, significant central canal or foraminal stenosis.   . T12-L1: No disc herniation, significant central canal or foraminal stenosis.  SABRA L1-L2: No disc herniation, significant central canal or foraminal stenosis.  SABRA L2-L3: No disc herniation, significant central canal or foraminal stenosis.  SABRA L3-L4: No disc herniation, significant central canal or foraminal stenosis. Small broad-based disc bulge. Mild facet arthrosis.  SABRA L4-L5: Small posterior annular fissure. Small concentric disc bulge. Mild facet arthrosis and ligamentum flavum thickening.  SABRA L5-S1: Far lateral left foraminal disc osteophyte complex placing mass effect on the left L5 nerve root. Mild left marrow edema. Small left paracentral focal disc protrusion abutting the left S1 nerve root. Moderate asymmetric left-sided disc space narrowing.   . Soft tissues are grossly unremarkable    IMPRESSION:  Far lateral left foraminal disc osteophyte complex at L5-S1 placing mass effect on the left L5 nerve root. Small left paracentral focal disc protrusion abutting the left S1 nerve root. Additional degenerative changes as detailed in the report.   Electronically Signed by: Prentice Clay, MD on 07/08/2024 8:25 AM   She reports that she has never smoked. She has never been exposed to tobacco smoke. She has never used smokeless tobacco. No results for input(s): HGBA1C, LABURIC in the last 8760 hours.  Objective:  VS:  HT:    WT:   BMI:     BP:   HR: bpm  TEMP: ( )  RESP:  Physical Exam Vitals and nursing note reviewed.  HENT:     Head: Normocephalic and atraumatic.     Right Ear: External ear normal.     Left Ear: External ear normal.     Nose: Nose normal.     Mouth/Throat:     Mouth: Mucous membranes are moist.  Eyes:      Extraocular Movements: Extraocular movements intact.  Cardiovascular:     Rate and Rhythm: Normal rate.     Pulses: Normal pulses.  Pulmonary:     Effort: Pulmonary effort is normal.  Abdominal:     General: Abdomen is flat. There is no distension.  Musculoskeletal:        General: Tenderness present.     Cervical back: Normal range of motion.     Comments: Patient rises from seated position to standing without difficulty. Good lumbar range of motion. No pain noted with facet loading. 5/5 strength noted with bilateral hip flexion, knee flexion/extension, ankle dorsiflexion/plantarflexion and EHL. No clonus noted bilaterally. Tenderness upon palpation of left IT band region/ greater trochanter. No pain with internal/external rotation of bilateral hips. Sensation intact bilaterally. Dysesthesias noted to left L5 dermatome. Negative slump test bilaterally. Ambulates without aid, gait steady.     Skin:    General: Skin is warm and dry.     Capillary Refill: Capillary refill takes less than 2 seconds.  Neurological:     General: No focal deficit present.     Mental Status: She is alert and oriented to person, place, and time.  Psychiatric:        Mood and Affect: Mood normal.  Behavior: Behavior normal.     Ortho Exam  Imaging: No results found.  Past Medical/Family/Surgical/Social History: Medications & Allergies reviewed per EMR, new medications updated. Patient Active Problem List   Diagnosis Date Noted   Disorder of bone density and structure, unspecified 05/20/2024   (BMI 25.0-29.9) 05/20/2024   FH: heart disease 04/09/2022   Osteopenia, -1.3 04/09/2022   Decreased estrogen level 04/08/2022   Hyperlipidemia, unspecified 04/08/2022   History of colonic polyps 04/08/2022   Varicose veins of bilateral lower extremities with other complications 04/08/2022   Arthritis 10/30/2020   Hypertension 10/30/2020   Female bladder prolapse 10/30/2020   Past Medical History:   Diagnosis Date   Allergy    Arthritis 2014   Chicken pox    Colon polyps    History of cystocele 10/30/2020   Hyperlipidemia, unspecified    Hypertension 2012   Migraine 10/30/2020   Ovarian cyst    Squamous cell skin cancer, face    Family History  Problem Relation Age of Onset   Hyperlipidemia Mother    Arthritis Mother    Heart disease Mother    Hypertension Mother    Vision loss Mother    Varicose Veins Mother    Hyperlipidemia Father    Arthritis Father    Heart disease Father    Hypertension Father    Heart attack Father    Healthy Daughter        [place], [place] and [place]   Colon cancer Maternal Aunt    Hyperlipidemia Maternal Grandmother    Hypertension Maternal Grandmother    Heart attack Maternal Grandmother    Heart disease Maternal Grandmother    Colon cancer Maternal Grandfather    Hyperlipidemia Paternal Grandmother    Hypertension Paternal Grandmother    Heart attack Paternal Grandmother    Heart disease Paternal Grandmother    Hypertension Paternal Grandfather    Hyperlipidemia Paternal Grandfather    Heart attack Paternal Grandfather    Heart disease Paternal Grandfather    Arthritis Brother    Hyperlipidemia Brother    Hypertension Brother    Drug abuse Son    BRCA 1/2 Neg Hx    Breast cancer Neg Hx    Past Surgical History:  Procedure Laterality Date   OOPHORECTOMY Right 2004   large cyst of ovary-benign   Social History   Occupational History   Not on file  Tobacco Use   Smoking status: Never    Passive exposure: Never   Smokeless tobacco: Never  Vaping Use   Vaping status: Never Used  Substance and Sexual Activity   Alcohol use: Yes    Alcohol/week: 8.0 standard drinks of alcohol    Types: 8 Glasses of wine per week   Drug use: Never   Sexual activity: Not Currently    Partners: Male    Birth control/protection: None

## 2024-09-19 NOTE — Progress Notes (Unsigned)
 Pain Scale   Average Pain 5 Patient advising she has left hip and leg pain with constant pain . Injection gave no relief        +Driver, -BT, -Dye Allergies.

## 2024-09-27 DIAGNOSIS — Z23 Encounter for immunization: Secondary | ICD-10-CM | POA: Diagnosis not present

## 2024-09-28 ENCOUNTER — Ambulatory Visit (INDEPENDENT_AMBULATORY_CARE_PROVIDER_SITE_OTHER): Admitting: *Deleted

## 2024-09-28 VITALS — Ht 64.0 in | Wt 150.0 lb

## 2024-09-28 DIAGNOSIS — Z0000 Encounter for general adult medical examination without abnormal findings: Secondary | ICD-10-CM | POA: Diagnosis not present

## 2024-09-28 DIAGNOSIS — Z780 Asymptomatic menopausal state: Secondary | ICD-10-CM | POA: Diagnosis not present

## 2024-09-28 NOTE — Patient Instructions (Signed)
 Sarah Frost , Thank you for taking time to come for your Medicare Wellness Visit. I appreciate your ongoing commitment to your health goals. Please review the following plan we discussed and let me know if I can assist you in the future.   Screening recommendations/referrals: Colonoscopy:  Mammogram:  Bone Density:  Recommended yearly ophthalmology/optometry visit for glaucoma screening and checkup Recommended yearly dental visit for hygiene and checkup  Vaccinations: Influenza vaccine:  Pneumococcal vaccine:  Tdap vaccine:  Shingles vaccine:        Preventive Care 65 Years and Older, Female Preventive care refers to lifestyle choices and visits with your health care provider that can promote health and wellness. What does preventive care include? A yearly physical exam. This is also called an annual well check. Dental exams once or twice a year. Routine eye exams. Ask your health care provider how often you should have your eyes checked. Personal lifestyle choices, including: Daily care of your teeth and gums. Regular physical activity. Eating a healthy diet. Avoiding tobacco and drug use. Limiting alcohol use. Practicing safe sex. Taking low-dose aspirin every day. Taking vitamin and mineral supplements as recommended by your health care provider. What happens during an annual well check? The services and screenings done by your health care provider during your annual well check will depend on your age, overall health, lifestyle risk factors, and family history of disease. Counseling  Your health care provider may ask you questions about your: Alcohol use. Tobacco use. Drug use. Emotional well-being. Home and relationship well-being. Sexual activity. Eating habits. History of falls. Memory and ability to understand (cognition). Work and work Astronomer. Reproductive health. Screening  You may have the following tests or measurements: Height, weight, and BMI. Blood  pressure. Lipid and cholesterol levels. These may be checked every 5 years, or more frequently if you are over 42 years old. Skin check. Lung cancer screening. You may have this screening every year starting at age 2 if you have a 30-pack-year history of smoking and currently smoke or have quit within the past 15 years. Fecal occult blood test (FOBT) of the stool. You may have this test every year starting at age 61. Flexible sigmoidoscopy or colonoscopy. You may have a sigmoidoscopy every 5 years or a colonoscopy every 10 years starting at age 45. Hepatitis C blood test. Hepatitis B blood test. Sexually transmitted disease (STD) testing. Diabetes screening. This is done by checking your blood sugar (glucose) after you have not eaten for a while (fasting). You may have this done every 1-3 years. Bone density scan. This is done to screen for osteoporosis. You may have this done starting at age 23. Mammogram. This may be done every 1-2 years. Talk to your health care provider about how often you should have regular mammograms. Talk with your health care provider about your test results, treatment options, and if necessary, the need for more tests. Vaccines  Your health care provider may recommend certain vaccines, such as: Influenza vaccine. This is recommended every year. Tetanus, diphtheria, and acellular pertussis (Tdap, Td) vaccine. You may need a Td booster every 10 years. Zoster vaccine. You may need this after age 57. Pneumococcal 13-valent conjugate (PCV13) vaccine. One dose is recommended after age 35. Pneumococcal polysaccharide (PPSV23) vaccine. One dose is recommended after age 2. Talk to your health care provider about which screenings and vaccines you need and how often you need them. This information is not intended to replace advice given to you by your health  care provider. Make sure you discuss any questions you have with your health care provider. Document Released:  01/11/2016 Document Revised: 09/03/2016 Document Reviewed: 10/16/2015 Elsevier Interactive Patient Education  2017 [employer].  Fall Prevention in the Home Falls can cause injuries. They can happen to people of all ages. There are many things you can do to make your home safe and to help prevent falls. What can I do on the outside of my home? Regularly fix the edges of walkways and driveways and fix any cracks. Remove anything that might make you trip as you walk through a door, such as a raised step or threshold. Trim any bushes or trees on the path to your home. Use bright outdoor lighting. Clear any walking paths of anything that might make someone trip, such as rocks or tools. Regularly check to see if handrails are loose or broken. Make sure that both sides of any steps have handrails. Any raised decks and porches should have guardrails on the edges. Have any leaves, snow, or ice cleared regularly. Use sand or salt on walking paths during winter. Clean up any spills in your garage right away. This includes oil or grease spills. What can I do in the bathroom? Use night lights. Install grab bars by the toilet and in the tub and shower. Do not use towel bars as grab bars. Use non-skid mats or decals in the tub or shower. If you need to sit down in the shower, use a plastic, non-slip stool. Keep the floor dry. Clean up any water that spills on the floor as soon as it happens. Remove soap buildup in the tub or shower regularly. Attach bath mats securely with double-sided non-slip rug tape. Do not have throw rugs and other things on the floor that can make you trip. What can I do in the bedroom? Use night lights. Make sure that you have a light by your bed that is easy to reach. Do not use any sheets or blankets that are too big for your bed. They should not hang down onto the floor. Have a firm chair that has side arms. You can use this for support while you get dressed. Do not have  throw rugs and other things on the floor that can make you trip. What can I do in the kitchen? Clean up any spills right away. Avoid walking on wet floors. Keep items that you use a lot in easy-to-reach places. If you need to reach something above you, use a strong step stool that has a grab bar. Keep electrical cords out of the way. Do not use floor polish or wax that makes floors slippery. If you must use wax, use non-skid floor wax. Do not have throw rugs and other things on the floor that can make you trip. What can I do with my stairs? Do not leave any items on the stairs. Make sure that there are handrails on both sides of the stairs and use them. Fix handrails that are broken or loose. Make sure that handrails are as long as the stairways. Check any carpeting to make sure that it is firmly attached to the stairs. Fix any carpet that is loose or worn. Avoid having throw rugs at the top or bottom of the stairs. If you do have throw rugs, attach them to the floor with carpet tape. Make sure that you have a light switch at the top of the stairs and the bottom of the stairs. If you do not  have them, ask someone to add them for you. What else can I do to help prevent falls? Wear shoes that: Do not have high heels. Have rubber bottoms. Are comfortable and fit you well. Are closed at the toe. Do not wear sandals. If you use a stepladder: Make sure that it is fully opened. Do not climb a closed stepladder. Make sure that both sides of the stepladder are locked into place. Ask someone to hold it for you, if possible. Clearly mark and make sure that you can see: Any grab bars or handrails. First and last steps. Where the edge of each step is. Use tools that help you move around (mobility aids) if they are needed. These include: Canes. Walkers. Scooters. Crutches. Turn on the lights when you go into a dark area. Replace any light bulbs as soon as they burn out. Set up your furniture so  you have a clear path. Avoid moving your furniture around. If any of your floors are uneven, fix them. If there are any pets around you, be aware of where they are. Review your medicines with your doctor. Some medicines can make you feel dizzy. This can increase your chance of falling. Ask your doctor what other things that you can do to help prevent falls. This information is not intended to replace advice given to you by your health care provider. Make sure you discuss any questions you have with your health care provider. Document Released: 10/11/2009 Document Revised: 05/22/2016 Document Reviewed: 01/19/2015 Elsevier Interactive Patient Education  2017 [employer].

## 2024-09-28 NOTE — Progress Notes (Signed)
 Subjective:   Sarah Frost is a 68 y.o. female who presents for Medicare Annual (Subsequent) preventive examination.  Visit Complete: Virtual I connected with  ALEXA BLISH on 09/28/24 by a audio enabled telemedicine application and verified that I am speaking with the correct person using two identifiers.  Patient Location: Home  Provider Location: Home Office  I discussed the limitations of evaluation and management by telemedicine. The patient expressed understanding and agreed to proceed.  Vital Signs: Because this visit was a virtual/telehealth visit, some criteria may be missing or patient reported. Any vitals not documented were not able to be obtained and vitals that have been documented are patient reported.  Patient Medicare AWV questionnaire was completed by the patient on 9+30-2025; I have confirmed that all information answered by patient is correct and no changes since this date.        Objective:    Today's Vitals   09/28/24 1557  Weight: 150 lb (68 kg)  Height: 5' 4 (1.626 m)   Body mass index is 25.75 kg/m.     09/28/2024    3:54 PM 08/19/2023    1:05 PM 08/06/2022   11:37 AM  Advanced Directives  Does Patient Have a Medical Advance Directive? No Yes Yes  Type of Special educational needs teacher of [place];Living will Healthcare Power of Attorney  Copy of Healthcare Power of Attorney in Chart?  No - copy requested No - copy requested  Would patient like information on creating a medical advance directive? No - Patient declined      Current Medications (verified) Outpatient Encounter Medications as of 09/28/2024  Medication Sig   calcium carbonate (OSCAL) 1500 (600 Ca) MG TABS tablet 1 tablet with meals   estradiol  (ESTRACE ) 0.1 MG/GM vaginal cream 2x weekly   loratadine (CLARITIN) 10 MG tablet 1 tablet   losartan  (COZAAR ) 25 MG tablet Take 0.5 tablets (12.5 mg total) by mouth daily.   magnesium citrate SOLN Take 296 mLs by mouth once.   No  facility-administered encounter medications on file as of 09/28/2024.    Allergies (verified) Penicillins   History: Past Medical History:  Diagnosis Date   Allergy    Arthritis 2014   Chicken pox    Colon polyps    History of cystocele 10/30/2020   Hyperlipidemia, unspecified    Hypertension 2012   Migraine 10/30/2020   Ovarian cyst    Squamous cell skin cancer, face    Past Surgical History:  Procedure Laterality Date   OOPHORECTOMY Right 2004   large cyst of ovary-benign   Family History  Problem Relation Age of Onset   Hyperlipidemia Mother    Arthritis Mother    Heart disease Mother    Hypertension Mother    Vision loss Mother    Varicose Veins Mother    Hyperlipidemia Father    Arthritis Father    Heart disease Father    Hypertension Father    Heart attack Father    Healthy Daughter        [place], [place] and [place]   Colon cancer Maternal Aunt    Hyperlipidemia Maternal Grandmother    Hypertension Maternal Grandmother    Heart attack Maternal Grandmother    Heart disease Maternal Grandmother    Colon cancer Maternal Grandfather    Hyperlipidemia Paternal Grandmother    Hypertension Paternal Grandmother    Heart attack Paternal Grandmother    Heart disease Paternal Grandmother    Hypertension Paternal Actor  Hyperlipidemia Paternal Grandfather    Heart attack Paternal Grandfather    Heart disease Paternal Grandfather    Arthritis Brother    Hyperlipidemia Brother    Hypertension Brother    Drug abuse Son    BRCA 1/2 Neg Hx    Breast cancer Neg Hx    Social History   Socioeconomic History   Marital status: Married    Spouse name: Not on file   Number of children: Not on file   Years of education: Not on file   Highest education level: Bachelor's degree (e.g., BA, AB, BS)  Occupational History   Not on file  Tobacco Use   Smoking status: Never    Passive exposure: Never   Smokeless tobacco: Never  Vaping Use   Vaping status: Never  Used  Substance and Sexual Activity   Alcohol use: Yes    Alcohol/week: 8.0 standard drinks of alcohol    Types: 8 Glasses of wine per week   Drug use: Never   Sexual activity: Not Currently    Partners: Male    Birth control/protection: None  Other Topics Concern   Not on file  Social History Narrative   Marital status/children/pets: Married   Education/employment: B.A., employed in  Engineer, manufacturing:      -Wears a bicycle helmet riding a bike: Yes     -smoke alarm in the home:Yes     - wears seatbelt: Yes     - Feels safe in their relationships: Yes      Social Drivers of Corporate investment banker Strain: Low Risk  (09/28/2024)   Overall Financial Resource Strain (CARDIA)    Difficulty of Paying Living Expenses: Not hard at all  Food Insecurity: No Food Insecurity (09/28/2024)   Hunger Vital Sign    Worried About Running Out of Food in the Last Year: Never true    Ran Out of Food in the Last Year: Never true  Transportation Needs: No Transportation Needs (09/28/2024)   PRAPARE - Administrator, Civil Service (Medical): No    Lack of Transportation (Non-Medical): No  Physical Activity: Sufficiently Active (09/28/2024)   Exercise Vital Sign    Days of Exercise per Week: 6 days    Minutes of Exercise per Session: 70 min  Stress: No Stress Concern Present (09/28/2024)   [employer] of Occupational Health - Occupational Stress Questionnaire    Feeling of Stress: Not at all  Social Connections: Moderately Integrated (09/28/2024)   Social Connection and Isolation Panel    Frequency of Communication with Friends and Family: More than three times a week    Frequency of Social Gatherings with Friends and Family: More than three times a week    Attends Religious Services: Never    Database administrator or Organizations: Yes    Attends Engineer, structural: More than 4 times per year    Marital Status: Married    Tobacco Counseling Counseling  given: Not Answered   Clinical Intake:  Pre-visit preparation completed: Yes  Pain : No/denies pain     Diabetes: No  How often do you need to have someone help you when you read instructions, pamphlets, or other written materials from your doctor or pharmacy?: 1 - Never  Interpreter Needed?: No  Information entered by :: Mliss Graff LPN   Activities of Daily Living    09/27/2024    8:39 AM 08/20/2024    3:07 PM  In your  present state of health, do you have any difficulty performing the following activities:  Hearing? 0 0  Vision? 0 0  Difficulty concentrating or making decisions? 0 0  Walking or climbing stairs? 0 0  Dressing or bathing? 0 0  Doing errands, shopping? 0 0  Preparing Food and eating ? N N  Using the Toilet? N N  In the past six months, have you accidently leaked urine? N N  Do you have problems with loss of bowel control? N N  Managing your Medications? N N  Managing your Finances? N N  Housekeeping or managing your Housekeeping? N N    Patient Care Team: Catherine Charlies LABOR, DO as PCP - General (Family Medicine) [facility] as Consulting Physician (Dermatology) Marne Kelly Nest, MD as Consulting Physician (Obstetrics and Gynecology) Vernona Slough, [place] (Ophthalmology) Saintclair Jasper, MD as Consulting Physician (Gastroenterology) [facility] as Consulting Physician (Peripheral Vascular Disease) Marquette Ozell BIRCH, DO as Referring Physician (Sports Medicine)  Indicate any recent Medical Services you may have received from other than [facility] providers in the past year (date may be approximate).     Assessment:   This is a routine wellness examination for Sarah Frost.  Hearing/Vision screen Hearing Screening - Comments:: No toruble hearing Vision Screening - Comments:: [facility] to date   Goals Addressed             This Visit's Progress    Patient Stated       Loose weight       Depression Screen    09/28/2024     3:56 PM 05/20/2024   10:10 AM 08/19/2023    1:04 PM 06/16/2023    9:58 AM 08/06/2022   11:35 AM 04/09/2022    8:46 AM  PHQ 2/9 Scores  PHQ - 2 Score 0 0 0 0 0 0  PHQ- 9 Score 0 0        Fall Risk    09/27/2024    8:39 AM 08/20/2024    3:07 PM 05/20/2024   10:10 AM 11/30/2023   12:41 PM 08/16/2023   10:17 AM  Fall Risk   Falls in the past year? 0 0 0 0 0  Number falls in past yr: 0 0   0  Injury with Fall? 0 0   0  Risk for fall due to :     Impaired vision  Follow up   Falls evaluation completed  Falls prevention discussed    MEDICARE RISK AT HOME: Medicare Risk at Home Any stairs in or around the home?: (Patient-Rptd) Yes If so, are there any without handrails?: (Patient-Rptd) No Home free of loose throw rugs in walkways, pet beds, electrical cords, etc?: (Patient-Rptd) Yes Adequate lighting in your home to reduce risk of falls?: (Patient-Rptd) Yes Life alert?: (Patient-Rptd) No Use of a cane, walker or w/c?: (Patient-Rptd) No Grab bars in the bathroom?: (Patient-Rptd) Yes Shower chair or bench in shower?: (Patient-Rptd) Yes Elevated toilet seat or a handicapped toilet?: (Patient-Rptd) No  TIMED UP AND GO:  Was the test performed?  No    Cognitive Function:        09/28/2024    3:55 PM 08/19/2023    1:06 PM 08/06/2022   11:41 AM  6CIT Screen  What Year? 0 points 0 points 0 points  What month? 0 points 0 points 0 points  What time? 0 points 0 points 0 points  Count back from 20 0 points 0 points 0 points  Months  in reverse 0 points 0 points 0 points  Repeat phrase 0 points 0 points 0 points  Total Score 0 points 0 points 0 points    Immunizations Immunization History  Administered Date(s) Administered   Fluad Quad(high Dose 65+) 09/17/2022   Influenza Split 11/01/2018, 09/16/2021   Influenza,inj,Quad PF,6+ Mos 10/20/2016, 11/01/2018, 10/04/2020   Influenza,inj,quad, With Preservative 09/18/2015   Influenza-Unspecified 09/18/2023   PFIZER(Purple Top)SARS-COV-2  Vaccination 03/04/2020, 03/27/2020, 10/04/2020, 04/17/2021, 09/16/2021   PNEUMOCOCCAL CONJUGATE-20 08/01/2021   Tdap 09/18/2015   Zoster Recombinant(Shingrix) 08/28/2021, 10/29/2021   Zoster, Live 08/28/2021, 10/29/2021    TDAP status: Up to date  Flu Vaccine status: Up to date  Pneumococcal vaccine status: Up to date  Covid-19 vaccine status: Information provided on how to obtain vaccines.   Qualifies for Shingles Vaccine? No   Zostavax completed Yes   Shingrix Completed?: Yes  Screening Tests Health Maintenance  Topic Date Due   Influenza Vaccine  07/29/2024   DEXA SCAN  01/06/2025   DTaP/Tdap/Td (2 - Td or Tdap) 09/17/2025   Medicare Annual Wellness (AWV)  09/28/2025   Mammogram  11/30/2025   Colonoscopy  08/20/2028   Pneumococcal Vaccine: 50+ Years  Completed   Hepatitis C Screening  Completed   HPV VACCINES  Aged Out   Meningococcal B Vaccine  Aged Out   COVID-19 Vaccine  Discontinued   Zoster Vaccines- Shingrix  Discontinued    Health Maintenance  Health Maintenance Due  Topic Date Due   Influenza Vaccine  07/29/2024    Colorectal cancer screening: Type of screening: Colonoscopy. Completed 2024. Repeat every 5 years  Mammogram status: Completed  . Repeat every year  Bone Density status: Ordered  . Pt provided with contact info and advised to call to schedule appt.  Lung Cancer Screening: (Low Dose CT Chest recommended if Age 89-80 years, 20 pack-year currently smoking OR have quit w/in 15years.) does not qualify.   Lung Cancer Screening Referral:   Additional Screening:  Hepatitis C Screening: does not qualify; Completed 2022  Vision Screening: Recommended annual ophthalmology exams for early detection of glaucoma and other disorders of the eye. Is the patient up to date with their annual eye exam?  Yes  Who is the provider or what is the name of the office in which the patient attends annual eye exams? [facility] If pt is not established  with a provider, would they like to be referred to a provider to establish care? No .   Dental Screening: Recommended annual dental exams for proper oral hygiene    Community Resource Referral / Chronic Care Management: CRR required this visit?  No   CCM required this visit?  No     Plan:     I have personally reviewed and noted the following in the patient's chart:   Medical and social history Use of alcohol, tobacco or illicit drugs  Current medications and supplements including opioid prescriptions. Patient is not currently taking opioid prescriptions. Functional ability and status Nutritional status Physical activity Advanced directives List of other physicians Hospitalizations, surgeries, and ER visits in previous 12 months Vitals Screenings to include cognitive, depression, and falls Referrals and appointments  In addition, I have reviewed and discussed with patient certain preventive protocols, quality metrics, and best practice recommendations. A written personalized care plan for preventive services as well as general preventive health recommendations were provided to patient.     Mliss Graff, LPN   [id number]   After Visit Summary: (MyChart) Due to  this being a telephonic visit, the after visit summary with patients personalized plan was offered to patient via MyChart   Nurse Notes:

## 2024-10-05 ENCOUNTER — Encounter: Payer: Self-pay | Admitting: Physical Medicine and Rehabilitation

## 2024-10-05 ENCOUNTER — Other Ambulatory Visit: Payer: Self-pay | Admitting: Physical Medicine and Rehabilitation

## 2024-10-05 MED ORDER — DIAZEPAM 5 MG PO TABS: ORAL_TABLET | ORAL | 0 refills | Status: DC

## 2024-10-10 ENCOUNTER — Other Ambulatory Visit: Payer: Self-pay

## 2024-10-10 ENCOUNTER — Ambulatory Visit (INDEPENDENT_AMBULATORY_CARE_PROVIDER_SITE_OTHER): Admitting: Physical Medicine and Rehabilitation

## 2024-10-10 VITALS — BP 154/83 | HR 69

## 2024-10-10 DIAGNOSIS — M5416 Radiculopathy, lumbar region: Secondary | ICD-10-CM

## 2024-10-10 MED ORDER — METHYLPREDNISOLONE ACETATE 80 MG/ML IJ SUSP
80.0000 mg | Freq: Once | INTRAMUSCULAR | Status: AC
  Administered 2024-10-10: 80 mg

## 2024-10-10 NOTE — Progress Notes (Signed)
 Pain Scale   Average Pain 5 Patient advising she has chronic lower back pain radiating to left leg.         +Driver, -BT, -Dye Allergies.

## 2024-10-17 NOTE — Progress Notes (Signed)
 Sarah Frost - 68 y.o. female MRN [id number]  Date of birth: 1956-12-22  Office Visit Note: Visit Date: 10/10/2024 PCP: Catherine Charlies LABOR, DO Referred by: Catherine Charlies LABOR, DO  Subjective: Chief Complaint  Patient presents with   Lower Back - Pain   HPI:  Sarah Frost is a 68 y.o. female who comes in today at the request of Duwaine Pouch, FNP for planned Left L5-S1 Lumbar Interlaminar epidural steroid injection with fluoroscopic guidance.  The patient has failed conservative care including home exercise, medications, time and activity modification.  This injection will be diagnostic and hopefully therapeutic.  Please see requesting physician notes for further details and justification.   ROS Otherwise per HPI.  Assessment & Plan: Visit Diagnoses:    ICD-10-CM   1. Lumbar radiculopathy  M54.16 XR C-ARM NO REPORT    Epidural Steroid injection    methylPREDNISolone  acetate (DEPO-MEDROL ) injection 80 mg      Plan: No additional findings.   Meds & Orders:  Meds ordered this encounter  Medications   methylPREDNISolone  acetate (DEPO-MEDROL ) injection 80 mg    Orders Placed This Encounter  Procedures   XR C-ARM NO REPORT   Epidural Steroid injection    Follow-up: Return for visit to requesting provider as needed.   Procedures: No procedures performed  Lumbar Epidural Steroid Injection - Interlaminar Approach with Fluoroscopic Guidance  Patient: Sarah Frost      Date of Birth: 1956/03/10 MRN: [id number] PCP: Catherine Charlies LABOR, DO      Visit Date: 10/10/2024   Universal Protocol:     Consent Given By: the patient  Position: PRONE  Additional Comments: Vital signs were monitored before and after the procedure. Patient was prepped and draped in the usual sterile fashion. The correct patient, procedure, and site was verified.   Injection Procedure Details:   Procedure diagnoses: Lumbar radiculopathy [M54.16]   Meds Administered:  Meds ordered this encounter   Medications   methylPREDNISolone  acetate (DEPO-MEDROL ) injection 80 mg     Laterality: Left  Location/Site:  L5-S1  Needle: 3.5 in., 20 ga. Tuohy  Needle Placement: Paramedian epidural  Findings:   -Comments: Excellent flow of contrast into the epidural space.  Procedure Details: Using a paramedian approach from the side mentioned above, the region overlying the inferior lamina was localized under fluoroscopic visualization and the soft tissues overlying this structure were infiltrated with 4 ml. of 1% Lidocaine without Epinephrine. The Tuohy needle was inserted into the epidural space using a paramedian approach.   The epidural space was localized using loss of resistance along with counter oblique bi-planar fluoroscopic views.  After negative aspirate for air, blood, and CSF, a 2 ml. volume of Isovue-250 was injected into the epidural space and the flow of contrast was observed. Radiographs were obtained for documentation purposes.    The injectate was administered into the level noted above.   Additional Comments:  The patient tolerated the procedure well Dressing: 2 x 2 sterile gauze and Band-Aid    Post-procedure details: Patient was observed during the procedure. Post-procedure instructions were reviewed.  Patient left the clinic in stable condition.    Clinical History: 07/08/2024  Formatting of this note might be different from the original.  TECHNIQUE: Multiplanar, multisequence MR imaging of the lumbar spine was obtained without contrast   COMPARISON: None   INDICATION: r/o nerve involvement as source of pain   FINDINGS:   . The conus medullaris appears normal, terminating at the L1 level. No  abnormal signal demonstrated within the visualized distal spinal cord. Cauda equina unremarkable.   . Vertebral body heights maintained. Marrow signal within normal limits for age.   . Alignment: Mild levoconvex scoliosis. No spondylolisthesis.  . Modic 1 endplate  changes/discogenic marrow edema:L5-S1  . Vertebral numbering:Standard   . The visualized lower thoracic spine demonstrates no disc herniation, significant central canal or foraminal stenosis.   . T12-L1: No disc herniation, significant central canal or foraminal stenosis.  SABRA L1-L2: No disc herniation, significant central canal or foraminal stenosis.  SABRA L2-L3: No disc herniation, significant central canal or foraminal stenosis.  SABRA L3-L4: No disc herniation, significant central canal or foraminal stenosis. Small broad-based disc bulge. Mild facet arthrosis.  SABRA L4-L5: Small posterior annular fissure. Small concentric disc bulge. Mild facet arthrosis and ligamentum flavum thickening.  SABRA L5-S1: Far lateral left foraminal disc osteophyte complex placing mass effect on the left L5 nerve root. Mild left marrow edema. Small left paracentral focal disc protrusion abutting the left S1 nerve root. Moderate asymmetric left-sided disc space narrowing.   . Soft tissues are grossly unremarkable    IMPRESSION:  Far lateral left foraminal disc osteophyte complex at L5-S1 placing mass effect on the left L5 nerve root. Small left paracentral focal disc protrusion abutting the left S1 nerve root. Additional degenerative changes as detailed in the report.   Electronically Signed by: Prentice Clay, MD on 07/08/2024 8:25 AM     Objective:  VS:  HT:    WT:   BMI:     BP:(!) 154/83  HR:69bpm  TEMP: ( )  RESP:  Physical Exam Vitals and nursing note reviewed.  Constitutional:      General: She is not in acute distress.    Appearance: Normal appearance. She is not ill-appearing.  HENT:     Head: Normocephalic and atraumatic.     Right Ear: External ear normal.     Left Ear: External ear normal.  Eyes:     Extraocular Movements: Extraocular movements intact.  Cardiovascular:     Rate and Rhythm: Normal rate.     Pulses: Normal pulses.  Pulmonary:     Effort: Pulmonary effort is normal. No respiratory  distress.  Abdominal:     General: There is no distension.     Palpations: Abdomen is soft.  Musculoskeletal:        General: Tenderness present.     Cervical back: Neck supple.     Right lower leg: No edema.     Left lower leg: No edema.     Comments: Patient has good distal strength with no pain over the greater trochanters.  No clonus or focal weakness.  Skin:    Findings: No erythema, lesion or rash.  Neurological:     General: No focal deficit present.     Mental Status: She is alert and oriented to person, place, and time.     Sensory: No sensory deficit.     Motor: No weakness or abnormal muscle tone.     Coordination: Coordination normal.  Psychiatric:        Mood and Affect: Mood normal.        Behavior: Behavior normal.      Imaging: No results found.

## 2024-10-17 NOTE — Procedures (Signed)
 Lumbar Epidural Steroid Injection - Interlaminar Approach with Fluoroscopic Guidance  Patient: Sarah Frost      Date of Birth: May 21, 1956 MRN: [id number] PCP: Catherine Charlies LABOR, DO      Visit Date: 10/10/2024   Universal Protocol:     Consent Given By: the patient  Position: PRONE  Additional Comments: Vital signs were monitored before and after the procedure. Patient was prepped and draped in the usual sterile fashion. The correct patient, procedure, and site was verified.   Injection Procedure Details:   Procedure diagnoses: Lumbar radiculopathy [M54.16]   Meds Administered:  Meds ordered this encounter  Medications   methylPREDNISolone  acetate (DEPO-MEDROL ) injection 80 mg     Laterality: Left  Location/Site:  L5-S1  Needle: 3.5 in., 20 ga. Tuohy  Needle Placement: Paramedian epidural  Findings:   -Comments: Excellent flow of contrast into the epidural space.  Procedure Details: Using a paramedian approach from the side mentioned above, the region overlying the inferior lamina was localized under fluoroscopic visualization and the soft tissues overlying this structure were infiltrated with 4 ml. of 1% Lidocaine without Epinephrine. The Tuohy needle was inserted into the epidural space using a paramedian approach.   The epidural space was localized using loss of resistance along with counter oblique bi-planar fluoroscopic views.  After negative aspirate for air, blood, and CSF, a 2 ml. volume of Isovue-250 was injected into the epidural space and the flow of contrast was observed. Radiographs were obtained for documentation purposes.    The injectate was administered into the level noted above.   Additional Comments:  The patient tolerated the procedure well Dressing: 2 x 2 sterile gauze and Band-Aid    Post-procedure details: Patient was observed during the procedure. Post-procedure instructions were reviewed.  Patient left the clinic in stable condition.

## 2024-10-31 ENCOUNTER — Encounter: Payer: Self-pay | Admitting: Radiology

## 2024-11-01 DIAGNOSIS — M1811 Unilateral primary osteoarthritis of first carpometacarpal joint, right hand: Secondary | ICD-10-CM | POA: Diagnosis not present

## 2024-11-04 ENCOUNTER — Ambulatory Visit (INDEPENDENT_AMBULATORY_CARE_PROVIDER_SITE_OTHER): Admitting: Family Medicine

## 2024-11-04 VITALS — BP 126/82 | HR 68 | Temp 98.1°F | Wt 152.0 lb

## 2024-11-04 DIAGNOSIS — I10 Essential (primary) hypertension: Secondary | ICD-10-CM

## 2024-11-04 DIAGNOSIS — E782 Mixed hyperlipidemia: Secondary | ICD-10-CM | POA: Diagnosis not present

## 2024-11-04 DIAGNOSIS — Z8249 Family history of ischemic heart disease and other diseases of the circulatory system: Secondary | ICD-10-CM

## 2024-11-04 DIAGNOSIS — M8589 Other specified disorders of bone density and structure, multiple sites: Secondary | ICD-10-CM | POA: Diagnosis not present

## 2024-11-04 DIAGNOSIS — Z23 Encounter for immunization: Secondary | ICD-10-CM | POA: Diagnosis not present

## 2024-11-04 DIAGNOSIS — Z1231 Encounter for screening mammogram for malignant neoplasm of breast: Secondary | ICD-10-CM

## 2024-11-04 MED ORDER — LOSARTAN POTASSIUM 25 MG PO TABS: 12.5000 mg | ORAL_TABLET | Freq: Every day | ORAL | 1 refills | Status: AC

## 2024-11-04 NOTE — Patient Instructions (Addendum)
 Return in about 24 weeks (around 04/21/2025) for Routine chronic condition follow-up.        Great to see you today.  I have refilled the medication(s) we provide.   If labs were collected or images ordered, we will inform you of  results once we have received them and reviewed. We will contact you either by echart message, or telephone call.  Please give ample time to the testing facility, and our office to run,  receive and review results. Please do not call inquiring of results, even if you can see them in your chart. We will contact you as soon as we are able. If it has been over 1 week since the test was completed, and you have not yet heard from us , then please call us .    - echart message- for normal results that have been seen by the patient already.   - telephone call: abnormal results or if patient has not viewed results in their echart.  If a referral to a specialist was entered for you, please call us  in 2 weeks if you have not heard from the specialist office to schedule.

## 2024-11-04 NOTE — Progress Notes (Signed)
 Patient ID: Sarah Frost, female  DOB: 1956-12-24, 68 y.o.   MRN: [id number] Patient Care Team    Relationship Specialty Notifications Start End  Sarah Frost LABOR, DO PCP - General Family Medicine  04/09/22   [facility] Consulting Physician Dermatology  04/09/22   Sarah Kelly Nest, MD Consulting Physician Obstetrics and Gynecology  04/09/22   Sarah Frost, OD  Ophthalmology  04/09/22   Sarah Jasper, MD Consulting Physician Gastroenterology  04/09/22   [facility] Consulting Physician Peripheral Vascular Disease  04/09/22   Sarah Ozell BIRCH, DO Referring Physician Sports Medicine  04/09/22     Chief Complaint  Patient presents with   Hypertension    Subjective:  Sarah Frost is a 68 y.o. female present for Chronic Conditions/illness Management All past medical history, surgical history, allergies, family history, immunizations, medications and social history were updated in the electronic medical record today. All recent labs, ED visits and hospitalizations within the last year were reviewed.  HTN/HLD: Pt reports compliance with losartan  12.5 mg daily.   Patient denies chest pain, shortness of breath, dizziness or lower extremity edema.  Patient had been on Maxide in the past and was having continued electrolyte disturbances which required change in therapy. Pt does not take a daily baby ASA. Pt is not prescribed statin. Diet: Low-sodium RF:fh heart disease, HTN, HLD  Osteopenia: T score -1.3 completed January 2023.  She does supplement with calcium and vitamin D .  She exercises routinely.  Estrogen deficiency: Patient reports she would like to continue her Estrace  0.1 mg/GM vaginal cream twice weekly.  No negative side effects up-to-date with mammograms     09/28/2024    3:56 PM 05/20/2024   10:10 AM 08/19/2023    1:04 PM 06/16/2023    9:58 AM 08/06/2022   11:35 AM  Depression screen PHQ 2/9  Decreased Interest 0 0 0 0 0  Down, Depressed, Hopeless  0 0 0 0 0  PHQ - 2 Score 0 0 0 0 0  Altered sleeping 0 0     Tired, decreased energy 0 0     Change in appetite 0 0     Feeling bad or failure about yourself  0 0     Trouble concentrating 0 0     Moving slowly or fidgety/restless 0 0     Suicidal thoughts 0 0     PHQ-9 Score 0  0      Difficult doing work/chores Not difficult at all Not difficult at all        Data saved with a previous flowsheet row definition      05/20/2024   10:10 AM  GAD 7 : Generalized Anxiety Score  Nervous, Anxious, on Edge 0  Control/stop worrying 0  Worry too much - different things 0  Trouble relaxing 0  Restless 0  Easily annoyed or irritable 0  Afraid - awful might happen 0  Total GAD 7 Score 0  Anxiety Difficulty Not difficult at all          11/04/2024    9:56 AM 09/27/2024    8:39 AM 08/20/2024    3:07 PM 05/20/2024   10:10 AM 11/30/2023   12:41 PM  Fall Risk   Falls in the past year? 0 0 0 0 0  Number falls in past yr: 0 0 0    Injury with Fall? 0 0 0    Risk for fall due to : No Fall Risks  Follow up Falls evaluation completed   Falls evaluation completed     Immunization History  Administered Date(s) Administered   Fluad Quad(high Dose 65+) 09/17/2022   INFLUENZA, HIGH DOSE SEASONAL PF 09/18/2023, 09/27/2024   Influenza Split 11/01/2018, 09/16/2021   Influenza,inj,Quad PF,6+ Mos 10/20/2016, 11/01/2018, 10/04/2020   Influenza,inj,quad, With Preservative 09/18/2015   Influenza-Unspecified 09/18/2023   Moderna Covid-19 Fall Seasonal Vaccine 74yrs & older 10/03/2022, 09/18/2023   PFIZER Comirnaty(Gray Top)Covid-19 Tri-Sucrose Vaccine 04/17/2021   PFIZER(Purple Top)SARS-COV-2 Vaccination 03/04/2020, 03/27/2020, 10/04/2020, 04/17/2021, 09/16/2021   PNEUMOCOCCAL CONJUGATE-20 08/01/2021   Pfizer Covid-19 Vaccine Bivalent Booster 67yrs & up 09/16/2021   Respiratory Syncytial Virus Vaccine,Recomb Aduvanted(Arexvy) 10/03/2022   Tdap 09/18/2015   Zoster Recombinant(Shingrix)  08/28/2021, 10/29/2021   Zoster, Live 08/28/2021, 10/29/2021   Past Medical History:  Diagnosis Date   Allergy    Arthritis 2014   Chicken pox    Colon polyps    History of cystocele 10/30/2020   Hyperlipidemia, unspecified    Hypertension 2012   Migraine 10/30/2020   Ovarian cyst    Squamous cell skin cancer, face    Allergies  Allergen Reactions   Penicillins Hives   Past Surgical History:  Procedure Laterality Date   OOPHORECTOMY Right 2004   large cyst of ovary-benign   Family History  Problem Relation Age of Onset   Hyperlipidemia Mother    Arthritis Mother    Heart disease Mother    Hypertension Mother    Vision loss Mother    Varicose Veins Mother    Hyperlipidemia Father    Arthritis Father    Heart disease Father    Hypertension Father    Heart attack Father    Healthy Daughter        [place], [place] and [place]   Colon cancer Maternal Aunt    Hyperlipidemia Maternal Grandmother    Hypertension Maternal Grandmother    Heart attack Maternal Grandmother    Heart disease Maternal Grandmother    Colon cancer Maternal Grandfather    Hyperlipidemia Paternal Grandmother    Hypertension Paternal Grandmother    Heart attack Paternal Grandmother    Heart disease Paternal Grandmother    Hypertension Paternal Grandfather    Hyperlipidemia Paternal Grandfather    Heart attack Paternal Grandfather    Heart disease Paternal Grandfather    Arthritis Brother    Hyperlipidemia Brother    Hypertension Brother    Drug abuse Son    BRCA 1/2 Neg Hx    Breast cancer Neg Hx    Social History   Social History Narrative   Marital status/children/pets: Married   Education/employment: B.A., employed in  Engineer, Manufacturing:      -Wears a bicycle helmet riding a bike: Yes     -smoke alarm in the home:Yes     - wears seatbelt: Yes     - Feels safe in their relationships: Yes       Allergies as of 11/04/2024       Reactions   Penicillins Hives         Medication List        Accurate as of November 04, 2024  9:58 AM. If you have any questions, ask your nurse or doctor.          STOP taking these medications    diazepam 5 MG tablet Commonly known as: VALIUM       TAKE these medications    calcium carbonate 1500 (600 Ca) MG Tabs tablet Commonly  known as: OSCAL 1 tablet with meals   estradiol  0.1 MG/GM vaginal cream Commonly known as: ESTRACE  2x weekly   loratadine 10 MG tablet Commonly known as: CLARITIN 1 tablet   losartan  25 MG tablet Commonly known as: COZAAR  Take 0.5 tablets (12.5 mg total) by mouth daily.   magnesium citrate Soln Take 296 mLs by mouth once.       All past medical history, surgical history, allergies, family history, immunizations andmedications were updated in the EMR today and reviewed under the history and medication portions of their EMR.     No results found for this or any previous visit (from the past 2160 hours).  MM 3D SCREEN BREAST BILATERAL Result Date: 11/05/2022 RECOMMENDATION: Screening mammogram in one year. (Code:SM-B-01Y) BI-RADS CATEGORY  1: Negative. Electronically Signed   By: Lael Hines M.D.   On: 11/05/2022 15:29    Review of Systems  All other systems reviewed and are negative.  14 pt review of systems performed and negative (unless mentioned in an HPI)  Objective: BP 126/82   Pulse 68   Temp 98.1 F (36.7 C)   Wt 152 lb (68.9 kg)   SpO2 96%   BMI 26.09 kg/m  Physical Exam Vitals and nursing note reviewed.  Constitutional:      General: She is not in acute distress.    Appearance: Normal appearance. She is not ill-appearing, toxic-appearing or diaphoretic.  HENT:     Head: Normocephalic and atraumatic.  Eyes:     General: No scleral icterus.       Right eye: No discharge.        Left eye: No discharge.     Extraocular Movements: Extraocular movements intact.     Conjunctiva/sclera: Conjunctivae normal.     Pupils: Pupils are equal, round, and  reactive to light.  Cardiovascular:     Rate and Rhythm: Normal rate and regular rhythm.  Pulmonary:     Effort: Pulmonary effort is normal. No respiratory distress.     Breath sounds: Normal breath sounds. No wheezing, rhonchi or rales.  Musculoskeletal:     Cervical back: Neck supple.     Right lower leg: No edema.     Left lower leg: No edema.  Skin:    General: Skin is warm.     Findings: No rash.  Neurological:     Mental Status: She is alert and oriented to person, place, and time. Mental status is at baseline.     Motor: No weakness.     Gait: Gait normal.  Psychiatric:        Mood and Affect: Mood normal.        Behavior: Behavior normal.        Thought Content: Thought content normal.        Judgment: Judgment normal.    No results found.  Assessment/plan: Sarah Frost is a 68 y.o. female present for condition management HTN/HLD Stable Continue  losartan  12.5 mg daily Cardiac CT: 10/29/2022- ZERO, consider rpt 5 years Labs UTD 04/2024  Osteopenia, unspecified location Continue vitamin D  and calcium supplement Continue routine exercise Limit caffeine and alcohol Vitamin D  levels 39.77 utd DEXA up-to-date, next DEXA due 2025/2026  Decreased estrogen level Stable Continue estradiol  0.1- 2 times weekly if desired   Return in about 24 weeks (around 04/21/2025) for Routine chronic condition follow-up.  No orders of the defined types were placed in this encounter.  Meds ordered this encounter  Medications   losartan  (COZAAR )  25 MG tablet    Sig: Take 0.5 tablets (12.5 mg total) by mouth daily.    Dispense:  45 tablet    Refill:  1   Referral Orders  No referral(s) requested today      Note is dictated utilizing voice recognition software. Although note has been proof read prior to signing, occasional typographical errors still can be missed. If any questions arise, please do not hesitate to call for verification.  Electronically signed by: Frost Bellini, DO [facility]

## 2024-12-06 ENCOUNTER — Other Ambulatory Visit: Payer: Self-pay | Admitting: Family Medicine

## 2024-12-06 DIAGNOSIS — Z1231 Encounter for screening mammogram for malignant neoplasm of breast: Secondary | ICD-10-CM

## 2024-12-10 DIAGNOSIS — M1811 Unilateral primary osteoarthritis of first carpometacarpal joint, right hand: Secondary | ICD-10-CM | POA: Diagnosis not present

## 2024-12-10 DIAGNOSIS — S52122A Displaced fracture of head of left radius, initial encounter for closed fracture: Secondary | ICD-10-CM | POA: Diagnosis not present

## 2024-12-10 DIAGNOSIS — M1812 Unilateral primary osteoarthritis of first carpometacarpal joint, left hand: Secondary | ICD-10-CM | POA: Diagnosis not present

## 2024-12-10 DIAGNOSIS — M79632 Pain in left forearm: Secondary | ICD-10-CM | POA: Diagnosis not present

## 2024-12-12 ENCOUNTER — Telehealth: Payer: Self-pay | Admitting: *Deleted

## 2024-12-12 NOTE — Telephone Encounter (Signed)
 Copied from CRM #[id number]. Topic: Appointments - Transfer of Care >> Dec 09, 2024  4:23 PM Shereese L wrote: Pt is requesting to transfer FROM: Dr. Catherine Pt is requesting to transfer TO: Dr.Hernandez Reason for requested transfer: .SABRA It is the responsibility of the team the patient would like to transfer to (Dr. Theophilus) to reach out to the patient if for any reason this transfer is not acceptable.

## 2025-01-05 ENCOUNTER — Ambulatory Visit

## 2025-01-05 DIAGNOSIS — Z1231 Encounter for screening mammogram for malignant neoplasm of breast: Secondary | ICD-10-CM

## 2025-01-13 ENCOUNTER — Ambulatory Visit
Admission: RE | Admit: 2025-01-13 | Discharge: 2025-01-13 | Disposition: A | Discharge status: Home / Self Care | Source: Ambulatory Visit | Attending: Family Medicine | Admitting: Family Medicine

## 2025-01-13 DIAGNOSIS — Z1231 Encounter for screening mammogram for malignant neoplasm of breast: Secondary | ICD-10-CM

## 2025-01-17 ENCOUNTER — Encounter: Payer: Self-pay | Admitting: Physical Medicine and Rehabilitation

## 2025-01-17 ENCOUNTER — Other Ambulatory Visit: Payer: Self-pay | Admitting: Physical Medicine and Rehabilitation

## 2025-01-17 ENCOUNTER — Telehealth: Payer: Self-pay

## 2025-01-17 DIAGNOSIS — M5416 Radiculopathy, lumbar region: Secondary | ICD-10-CM

## 2025-01-17 MED ORDER — DIAZEPAM 5 MG PO TABS: ORAL_TABLET | ORAL | 0 refills | Status: AC

## 2025-01-17 NOTE — Telephone Encounter (Signed)
 Pre Procedural Valium 

## 2025-01-19 ENCOUNTER — Ambulatory Visit: Payer: Self-pay | Admitting: Family Medicine

## 2025-01-27 ENCOUNTER — Telehealth: Payer: Self-pay | Admitting: Physical Medicine and Rehabilitation

## 2025-01-27 NOTE — Telephone Encounter (Signed)
 Pt called wanting to reschedule her apt on 2/9 at 9:30, She says she needs to move it out about a month. Call back number is [phone].

## 2025-02-01 ENCOUNTER — Encounter: Payer: Self-pay | Admitting: Internal Medicine

## 2025-02-01 ENCOUNTER — Ambulatory Visit: Admitting: Internal Medicine

## 2025-02-01 VITALS — BP 128/78 | HR 70 | Temp 98.2°F | Wt 144.9 lb

## 2025-02-01 DIAGNOSIS — I10 Essential (primary) hypertension: Secondary | ICD-10-CM

## 2025-02-01 DIAGNOSIS — M8589 Other specified disorders of bone density and structure, multiple sites: Secondary | ICD-10-CM

## 2025-02-01 DIAGNOSIS — E782 Mixed hyperlipidemia: Secondary | ICD-10-CM

## 2025-02-01 NOTE — Progress Notes (Signed)
 "    Established Patient Office Visit     CC/Reason for Visit: Establish care, discuss chronic medical conditions  HPI: Sarah Frost is a 69 y.o. female who is coming in today for the above mentioned reasons. Past Medical History is significant for: Hypertension, hyperlipidemia, osteopenia, postmenopausal vasomotor symptoms on estrogen therapy.  She is feeling well.  No acute concerns or complaints.  She is recovering from her right wrist surgery done by Dr. Arlyss back.  She does not smoke, drinks alcohol occasionally, has allergies to penicillin which caused a rash.  Family history significant for father with coronary artery disease, hypertension and impaired glucose tolerance.  Immunizations and cancer screenings are up-to-date.  Her bone density is scheduled for May.  She takes low-dose losartan  for blood pressure management.   Past Medical/Surgical History: Past Medical History:  Diagnosis Date   Allergy    Arthritis 2014   Chicken pox    Colon polyps    History of cystocele 10/30/2020   Hyperlipidemia, unspecified    Hypertension 2012   Migraine 10/30/2020   Ovarian cyst    Squamous cell skin cancer, face     Past Surgical History:  Procedure Laterality Date   OOPHORECTOMY Right 2004   large cyst of ovary-benign    Social History:  reports that she has never smoked. She has never been exposed to tobacco smoke. She has never used smokeless tobacco. She reports current alcohol use of about 8.0 standard drinks of alcohol per week. She reports that she does not use drugs.  Allergies: Allergies[1]  Family History:  Family History  Problem Relation Age of Onset   Hyperlipidemia Mother    Arthritis Mother    Heart disease Mother    Hypertension Mother    Vision loss Mother    Varicose Veins Mother    Hyperlipidemia Father    Arthritis Father    Heart disease Father    Hypertension Father    Heart attack Father    Healthy Daughter        [place], [place] and  [place]   Colon cancer Maternal Aunt    Hyperlipidemia Maternal Grandmother    Hypertension Maternal Grandmother    Heart attack Maternal Grandmother    Heart disease Maternal Grandmother    Colon cancer Maternal Grandfather    Hyperlipidemia Paternal Grandmother    Hypertension Paternal Grandmother    Heart attack Paternal Grandmother    Heart disease Paternal Grandmother    Hypertension Paternal Grandfather    Hyperlipidemia Paternal Grandfather    Heart attack Paternal Grandfather    Heart disease Paternal Grandfather    Arthritis Brother    Hyperlipidemia Brother    Hypertension Brother    Drug abuse Son    BRCA 1/2 Neg Hx    Breast cancer Neg Hx     Current Medications[2]  Review of Systems:  Negative unless indicated in HPI.   Physical Exam: Vitals:   02/01/25 1303 02/01/25 1415  BP: 130/80 128/78  Pulse: 70   Temp: 98.2 F (36.8 C)   TempSrc: Oral   SpO2: 99%   Weight: 144 lb 14.4 oz (65.7 kg)     Body mass index is 24.87 kg/m.   Physical Exam Vitals reviewed.  Constitutional:      Appearance: Normal appearance.  HENT:     Head: Normocephalic and atraumatic.  Eyes:     Conjunctiva/sclera: Conjunctivae normal.  Cardiovascular:     Rate and Rhythm: Normal rate and regular rhythm.  Pulmonary:     Effort: Pulmonary effort is normal.     Breath sounds: Normal breath sounds.  Skin:    General: Skin is warm and dry.  Neurological:     General: No focal deficit present.     Mental Status: She is alert and oriented to person, place, and time.  Psychiatric:        Mood and Affect: Mood normal.        Behavior: Behavior normal.        Thought Content: Thought content normal.        Judgment: Judgment normal.      Impression and Plan:  Mixed hyperlipidemia  Primary hypertension  Osteopenia of multiple sites   - Blood pressure is fairly well-controlled on losartan  25 mg. - In therapy, most recent lipid panel in May 2025 with an LDL  cholesterol of 144.  She is not on a statin but a fairly recent coronary CT showed a calcium score of 0. - She takes calcium and vitamin D  supplements in regards to her osteopenia and is due for repeat bone density in May.  Time spent:33 minutes reviewing chart, interviewing and examining patient and formulating plan of care.     Tully Theophilus Andrews, MD [facility]     [1]  Allergies Allergen Reactions   Penicillins Hives  [2]  Current Outpatient Medications:    diazepam  (VALIUM ) 5 MG tablet, Take one tablet by mouth with light food one hour prior to procedure., Disp: 1 tablet, Rfl: 0   estradiol  (ESTRACE ) 0.1 MG/GM vaginal cream, 2x weekly, Disp: 42.5 g, Rfl: 11   loratadine (CLARITIN) 10 MG tablet, 1 tablet, Disp: , Rfl:    losartan  (COZAAR ) 25 MG tablet, Take 0.5 tablets (12.5 mg total) by mouth daily., Disp: 45 tablet, Rfl: 1   magnesium citrate SOLN, Take 296 mLs by mouth once., Disp: , Rfl:    SEMAGLUTIDE, 2.5 mg., Disp: , Rfl:   "

## 2025-02-06 ENCOUNTER — Encounter: Admitting: Physical Medicine and Rehabilitation

## 2025-03-06 ENCOUNTER — Encounter: Admitting: Physical Medicine and Rehabilitation

## 2025-04-20 ENCOUNTER — Ambulatory Visit: Admitting: Family Medicine

## 2025-05-09 ENCOUNTER — Ambulatory Visit (HOSPITAL_BASED_OUTPATIENT_CLINIC_OR_DEPARTMENT_OTHER)

## 2025-05-15 ENCOUNTER — Ambulatory Visit: Admitting: Internal Medicine

## 2025-10-04 ENCOUNTER — Ambulatory Visit
